# Patient Record
Sex: Female | Born: 2000 | Race: Black or African American | Hispanic: No | Marital: Single | Smoking: Never smoker
Health system: Southern US, Community
[De-identification: ages and names within clinical notes are randomized; demographics above are authoritative.]

## PROBLEM LIST (undated history)

## (undated) DIAGNOSIS — J45909 Unspecified asthma, uncomplicated: Secondary | ICD-10-CM

## (undated) HISTORY — DX: Unspecified asthma, uncomplicated: J45.909

---

## 2020-03-05 ENCOUNTER — Encounter: Payer: Self-pay | Admitting: Family Medicine

## 2020-03-05 ENCOUNTER — Ambulatory Visit: Payer: Self-pay | Admitting: Family Medicine

## 2020-03-05 ENCOUNTER — Other Ambulatory Visit: Payer: Self-pay

## 2020-03-05 DIAGNOSIS — Z113 Encounter for screening for infections with a predominantly sexual mode of transmission: Secondary | ICD-10-CM

## 2020-03-05 LAB — WET PREP FOR TRICH, YEAST, CLUE
Trichomonas Exam: NEGATIVE
Yeast Exam: NEGATIVE

## 2020-03-05 LAB — HEPATITIS B SURFACE ANTIGEN: Hepatitis B Surface Ag: NONREACTIVE

## 2020-03-05 NOTE — Progress Notes (Signed)
Here today for STD screening. Accepts bloodwork. Veyda Kaufman, RN ° °

## 2020-03-05 NOTE — Progress Notes (Addendum)
[place] results reviewed by provider Elveria Rising, FNP. Per provider orders no treatment indicated. Tawny Hopping, RN

## 2020-03-05 NOTE — Progress Notes (Addendum)
[facility] Department STI clinic/screening visit  Subjective:  Jeanne Steele is a 19 y.o. female being seen today for an STI screening visit. The patient reports they do not have symptoms.  Patient reports that they do not desire a pregnancy in the next year.   They reported they are not interested in discussing contraception today.  Patient's last menstrual period was 02/20/2020 (exact date).   Patient has the following medical conditions:  There are no problems to display for this patient.   Chief Complaint  Patient presents with  . SEXUALLY TRANSMITTED DISEASE    Screening    HPI  Patient reports partner reports that previous partner is possibly a contact and wants to be screened today.  Patient denies any problems or symptoms.    Last HIV test per patient, no previous HIV testing.  Patient has not had previous pap due to age.    See flowsheet for further details and programmatic requirements.    The following portions of the patient's history were reviewed and updated as appropriate: allergies, current medications, past medical history, past social history, past surgical history and problem list.  Objective:  There were no vitals filed for this visit.  Physical Exam Constitutional:      Appearance: Normal appearance.  HENT:     Head: Normocephalic.     Comments: In scalp, brows and lashes: no nits, no hair loss    Mouth/Throat:     Mouth: Mucous membranes are moist.     Pharynx: Oropharynx is clear. No oropharyngeal exudate or posterior oropharyngeal erythema.  Abdominal:     General: Abdomen is flat.     Palpations: Abdomen is soft. There is no mass.     Tenderness: There is no abdominal tenderness. There is no guarding or rebound.  Genitourinary:    Comments: External genitalia without, lice, nits, erythema, edema , lesions or inguinal adenopathy. Vagina with normal mucosa and discharge pooled in the vaginal canal and pH equals 4.  Cervix without visual  lesions, uterus firm, mobile, non-tender, no masses, CMT adnexal fullness or tenderness.  Musculoskeletal:     Cervical back: Normal range of motion and neck supple.  Lymphadenopathy:     Cervical: No cervical adenopathy.  Skin:    General: Skin is warm and dry.     Findings: No bruising, erythema, lesion or rash.  Neurological:     Mental Status: She is alert.  Psychiatric:        Mood and Affect: Mood normal.        Behavior: Behavior normal.      Assessment and Plan:  Jeanne Steele is a 19 y.o. female presenting to the [facility] Department for STI screening  1. Screening examination for venereal disease  - Chlamydia/Gonorrhea [place] [facility] - Gonococcus culture - HBV Antigen/Antibody [facility] - HIV [place] [facility] - Syphilis Serology, [place] [facility] - WET PREP FOR TRICH, YEAST, CLUE  Patient accepted all screenings including oral GC, vaginal CT/GC and bloodwork for HIV, RPR and HBV.  Patient meets criteria for HepB screening? Yes. Ordered? Yes Patient meets criteria for HepC screening? No. Ordered? No - does not meet critera   Wet prep results positive amine and clue but no other symptoms indicate BV  No Treatment needed today, await results of CT/GC.  Discussed time line for State [facility] results and that patient will be called with positive results and encouraged patient to call if she had not heard in 2 weeks.  Counseled to return or seek care for continued or worsening symptoms Recommended condom use with all sex   Patient is currently using condoms, discussed with patient LARCS, please give info on nexplanon and IUD.   to prevent pregnancy.   Inform patient text message was sent for my chart sign up and account was made confidential  Patient to call if any questions or concerns.    No follow-ups on file.  No future appointments.  Wendi Snipes, FNP

## 2020-03-09 LAB — GONOCOCCUS CULTURE

## 2020-11-16 ENCOUNTER — Emergency Department: Payer: BC Managed Care – PPO

## 2020-11-16 ENCOUNTER — Other Ambulatory Visit: Payer: Self-pay

## 2020-11-16 ENCOUNTER — Emergency Department
Admission: EM | Admit: 2020-11-16 | Discharge: 2020-11-16 | Disposition: A | Discharge status: Home / Self Care | Payer: BC Managed Care – PPO | Attending: Emergency Medicine | Admitting: Emergency Medicine

## 2020-11-16 ENCOUNTER — Encounter: Payer: Self-pay | Admitting: Emergency Medicine

## 2020-11-16 DIAGNOSIS — R0981 Nasal congestion: Secondary | ICD-10-CM | POA: Diagnosis not present

## 2020-11-16 DIAGNOSIS — J9801 Acute bronchospasm: Secondary | ICD-10-CM | POA: Insufficient documentation

## 2020-11-16 DIAGNOSIS — Z20822 Contact with and (suspected) exposure to covid-19: Secondary | ICD-10-CM | POA: Diagnosis not present

## 2020-11-16 DIAGNOSIS — R0602 Shortness of breath: Secondary | ICD-10-CM | POA: Diagnosis present

## 2020-11-16 LAB — BASIC METABOLIC PANEL
Anion gap: 8 (ref 5–15)
BUN: 11 mg/dL (ref 6–20)
CO2: 22 mmol/L (ref 22–32)
Calcium: 8.9 mg/dL (ref 8.9–10.3)
Chloride: 107 mmol/L (ref 98–111)
Creatinine, Ser: 0.55 mg/dL (ref 0.44–1.00)
GFR, Estimated: >60 mL/min (ref >60)
Glucose, Bld: 87 mg/dL (ref 70–99)
Potassium: 3.5 mmol/L (ref 3.5–5.1)
Sodium: 137 mmol/L (ref 135–145)

## 2020-11-16 LAB — RESP PANEL BY RT-PCR (FLU A&B, COVID) ARPGX2
Influenza A by PCR: NEGATIVE
Influenza B by PCR: NEGATIVE
SARS Coronavirus 2 by RT PCR: NEGATIVE

## 2020-11-16 LAB — CBC
HCT: 34.4 % — ABNORMAL LOW (ref 36.0–46.0)
Hemoglobin: 11.7 g/dL — ABNORMAL LOW (ref 12.0–15.0)
MCH: 28.7 pg (ref 26.0–34.0)
MCHC: 34 g/dL (ref 30.0–36.0)
MCV: 84.5 fL (ref 80.0–100.0)
Platelets: 387 10*3/uL (ref 150–400)
RBC: 4.07 MIL/uL (ref 3.87–5.11)
RDW: 12.3 % (ref 11.5–15.5)
WBC: 8.3 10*3/uL (ref 4.0–10.5)
nRBC: 0 % (ref 0.0–0.2)

## 2020-11-16 LAB — TROPONIN I (HIGH SENSITIVITY)
Troponin I (High Sensitivity): <2 ng/L (ref <18)
Troponin I (High Sensitivity): <2 ng/L (ref <18)

## 2020-11-16 MED ORDER — ALBUTEROL SULFATE HFA 108 (90 BASE) MCG/ACT IN AERS: 1.0000 | INHALATION_SPRAY | Freq: Four times a day (QID) | RESPIRATORY_TRACT | 0 refills | Status: DC | PRN

## 2020-11-16 MED ORDER — IPRATROPIUM-ALBUTEROL 0.5-2.5 (3) MG/3ML IN SOLN: 3.0000 mL | Freq: Once | RESPIRATORY_TRACT | Status: AC

## 2020-11-16 MED ORDER — IPRATROPIUM-ALBUTEROL 0.5-2.5 (3) MG/3ML IN SOLN
RESPIRATORY_TRACT | Status: AC
  Administered 2020-11-16: 3 mL via RESPIRATORY_TRACT
  Filled 2020-11-16: qty 3

## 2020-11-16 NOTE — ED Notes (Signed)
POC urine pregnancy test result negative. 

## 2020-11-16 NOTE — ED Provider Notes (Signed)
[facility] Emergency Department Provider Note ____________________________________________  Time seen: 1715  I have reviewed the triage vital signs and the nursing notes.  HISTORY  Chief Complaint  Chest Pain and Shortness of Breath   HPI Jeanne Steele is a 20 y.o. female presents to the ER today with complaint of nasal congestion, cough, chest tightness and shortness of breath.  She reports this started last night.  The cough is productive of clear/green mucus.  She reports that shortness of breath is intermittent.  She reports the chest tightness has resolved.  She reports 2 similar episodes that started 1 month ago each 2 weeks apart which occurred after swimming.  She thought it could be related to the chemicals in the pool however she did not go swimming prior to the onset of this third episode.  She denies headache, runny nose, ear pain sore throat or chest pain.  She denies fever, chills or body aches.  She has not had exposure to COVID that she is aware of.  She does not smoke.  She is unsure if she has a history of asthma but recalls growing up with an inhaler in the house but unsure if she used it or not.  She has not tried anything OTC for her symptoms.  History reviewed. No pertinent past medical history.  There are no problems to display for this patient.   History reviewed. No pertinent surgical history.  Prior to Admission medications   Medication Sig Start Date End Date Taking? Authorizing Provider  albuterol (VENTOLIN HFA) 108 (90 Base) MCG/ACT inhaler Inhale 1-2 puffs into the lungs every 6 (six) hours as needed for wheezing or shortness of breath. 11/16/20  Yes Lorre Munroe, NP    Allergies Patient has no known allergies.  History reviewed. No pertinent family history.  Social History Social History   Tobacco Use   Smoking status: Never   Smokeless tobacco: Never  Vaping Use   Vaping Use: Never used  Substance Use Topics   Alcohol  use: Yes    Alcohol/week: 2.0 standard drinks    Types: 2 Shots of liquor per week    Comment: socially    Drug use: Not Currently    Types: Marijuana    Comment: >3 months ago     Review of Systems  Constitutional: Negative for fever, chills or body aches. ENT: Positive for nasal congestion.  Negative for runny nose, ear pain or sore throat. Cardiovascular: Positive for intermittent chest tightness.  Negative for chest pain. Respiratory: Positive for cough and shortness of breath. Gastrointestinal: Negative for abdominal pain, nausea or reflux. Musculoskeletal: Negative for back pain. Skin: Negative for rash. Neurological: Negative for headaches, focal weakness, tingling or numbness. ____________________________________________  PHYSICAL EXAM:  VITAL SIGNS: ED Triage Vitals  Enc Vitals Group     BP 11/16/20 1547 135/86     Pulse Rate 11/16/20 1547 (!) 109     Resp 11/16/20 1547 (!) 25     Temp 11/16/20 1547 98.2 F (36.8 C)     Temp Source 11/16/20 1547 Oral     SpO2 11/16/20 1547 99 %     Weight 11/16/20 1548 126 lb (57.2 kg)     Height 11/16/20 1548 5' (1.524 m)     Head Circumference --      Peak Flow --      Pain Score 11/16/20 1548 0     Pain Loc --      Pain Edu? --  Excl. in GC? --     Constitutional: Alert and oriented. Well appearing and in no distress. Head: Normocephalic. Cardiovascular: Tachycardic, regular rhythm.  Respiratory: Normal respiratory effort.  Intermittent expiratory wheeze noted.  No rales/rhonchi. Gastrointestinal: Soft and nontender. No distention. Musculoskeletal: Nontender with normal range of motion in all extremities.  Neurologic:  Normal gait without ataxia. Normal speech and language. No gross focal neurologic deficits are appreciated. Skin:  Skin is warm, dry and intact. No rash noted. Psychiatric: Mood and affect are normal. Patient exhibits appropriate insight and judgment. ____________________________________________    LABS  Labs Reviewed  CBC - Abnormal; Notable for the following components:      Result Value   Hemoglobin 11.7 (*)    HCT 34.4 (*)    All other components within normal limits  RESP PANEL BY RT-PCR (FLU A&B, COVID) ARPGX2  BASIC METABOLIC PANEL  POC URINE PREG, ED  TROPONIN I (HIGH SENSITIVITY)  TROPONIN I (HIGH SENSITIVITY)    ____________________________________________  EKG ED ECG REPORT   Date: 11/16/2020  EKG Time: 7:09 PM  Rate: 114  Rhythm: sinus tachycardia,    Axis: right  Intervals:none  ST&T Change: none  Narrative Interpretation: Sinus tachycardia, none for comparison    ____________________________________________   RADIOLOGY IMPRESSION: No active cardiopulmonary disease.  ____________________________________________    INITIAL IMPRESSION / ASSESSMENT AND PLAN / ED COURSE  Nasal Congestion, Cough, Shortness of Breath, Chest Tightness:  DDx include viral URI with cough, covid, pneumonia, new onset asthma ECG shows sinus tachycardia Chest xray negative for acute findings Duoneb given with improvement in symptoms CBC, BMET, Troponin shows mild anemia but no other acute findings Covid swab negative RX for Albuterol 1-2 puffs Q4-6H as needed She will establish care with a PCP for followup ____________________________________________  FINAL CLINICAL IMPRESSION(S) / ED DIAGNOSES  Final diagnoses:  Bronchospasm      Lorre Munroe, NP 11/16/20 [name]    Shaune Pollack, MD 11/18/20 1207

## 2020-11-16 NOTE — ED Notes (Signed)
Pt states that she went to a pool party at the end of May and afterwards noticed that she was having some difficulty breathing and insomnia. Last night her symptoms started again and she is coughing up mucus. She denies chest pain at this moment and says that the breathing treatment she received earlier helped a lot. She is unaware of any asthma diagnosis. NAD on assessment.

## 2020-11-16 NOTE — Discharge Instructions (Addendum)
You were seen today for cough, chest tightness and shortness of breath.  Your x-ray and EKG were normal.  Your exam seems consistent with asthma.  I am giving you an inhaler to use every 4-6 hours as needed.  Please follow-up with a primary care physician for further evaluation of your symptoms.

## 2020-11-16 NOTE — ED Triage Notes (Addendum)
Pt via POV from home. Pt c/o centralized CP and SOB. Pt does have some difficulty breathing. Pt having issues with speaking in complete sentence and is wheezing. Unknown if she has a hx of asthma .

## 2020-12-09 ENCOUNTER — Other Ambulatory Visit: Payer: Self-pay | Admitting: Internal Medicine

## 2021-05-13 ENCOUNTER — Other Ambulatory Visit: Payer: Self-pay

## 2021-05-13 ENCOUNTER — Encounter: Payer: Self-pay | Admitting: Family Medicine

## 2021-05-13 ENCOUNTER — Ambulatory Visit (INDEPENDENT_AMBULATORY_CARE_PROVIDER_SITE_OTHER): Payer: BC Managed Care – PPO | Admitting: Family Medicine

## 2021-05-13 VITALS — BP 98/72 | HR 105 | Ht 60.0 in | Wt 139.0 lb

## 2021-05-13 DIAGNOSIS — J4520 Mild intermittent asthma, uncomplicated: Secondary | ICD-10-CM

## 2021-05-13 MED ORDER — ALBUTEROL SULFATE HFA 108 (90 BASE) MCG/ACT IN AERS: 1.0000 | INHALATION_SPRAY | Freq: Four times a day (QID) | RESPIRATORY_TRACT | 2 refills | Status: DC | PRN

## 2021-05-13 NOTE — Patient Instructions (Addendum)
-   Continue asthma inhaler (albuterol) on an as-needed basis - Track the times you need it and what triggers the asthma attack (exposure to fumes, etc) - Obtain your medical records for vaccines and health updates (HPV, tetanus, pap, etc) - Return in 4 weeks

## 2021-05-13 NOTE — Progress Notes (Signed)
°  ° °  Primary Care / Sports Medicine Office Visit  Patient Information:  Patient ID: Jeanne Steele, female DOB: 04-08-2001 Age: 20 y.o. MRN: [id number]   Jeanne Steele is a pleasant 20 y.o. female presenting with the following:  Chief Complaint  Patient presents with   New Patient (Initial Visit)   Establish Care   Asthma    Recent diagnosis 11/16/20 at [facility] ER; needs refill on albuterol inhaler    Patient Active Problem List   Diagnosis Date Noted   Mild intermittent asthma 05/13/2021    Vitals:   05/13/21 1112  BP: 98/72  Pulse: (!) 105  SpO2: 99%   Vitals:   05/13/21 1112  Weight: 139 lb (63 kg)  Height: 5' (1.524 m)   Body mass index is 27.15 kg/m.  No results found.   Independent interpretation of notes and tests performed by another provider:   None  Procedures performed:   None  Pertinent History, Exam, Impression, and Recommendations:   Mild intermittent asthma Patient with stated history of asthma, uncertain about diagnosis timeframe, possibly childhood, has noted progression over the past several months beginning in 09/2020.  At that time after exposure to swimming pool chlorine, had tightness which alleviated with time and home remedies, this recurred a few months later resulting in ER visit to [facility] on 11/16/2020 where albuterol inhaler was prescribed.  Since that time she has noted intermittent shortness of air, wheezing, chest tightness, aggravation with cold air, exercise, fumes, smoke, no further progression/stability of symptoms, and no significant limitations in daily activity.  She has missed 1 day of work over the past 1 month, requires 1-2 uses of her albuterol rescue inhaler weekly, denies any nighttime awakenings.  Based on her stated symptomatology, frequency of albuterol usage, she is considered to have intermittent asthma.  At this stage we have reviewed further evaluation and treatment, I have prescribed albuterol for as  needed usage, and we will follow-up on her symptoms in 1 month's time at her annual physical.   Orders & Medications Meds ordered this encounter  Medications   DISCONTD: albuterol (VENTOLIN HFA) 108 (90 Base) MCG/ACT inhaler    Sig: Inhale 1-2 puffs into the lungs every 6 (six) hours as needed for wheezing or shortness of breath.    Dispense:  8 g    Refill:  2   albuterol (VENTOLIN HFA) 108 (90 Base) MCG/ACT inhaler    Sig: Inhale 1-2 puffs into the lungs every 6 (six) hours as needed for wheezing or shortness of breath.    Dispense:  8 g    Refill:  2   No orders of the defined types were placed in this encounter.    Return in about 4 weeks (around 06/10/2021) for Annual physical 40 min.     Jerrol Banana, MD   Primary Care Sports Medicine [facility]

## 2021-05-13 NOTE — Assessment & Plan Note (Addendum)
Patient with stated history of asthma, uncertain about diagnosis timeframe, possibly childhood, has noted progression over the past several months beginning in 09/2020.  At that time after exposure to swimming pool chlorine, had tightness which alleviated with time and home remedies, this recurred a few months later resulting in ER visit to [facility] on 11/16/2020 where albuterol inhaler was prescribed.  Since that time she has noted intermittent shortness of air, wheezing, chest tightness, aggravation with cold air, exercise, fumes, smoke, no further progression/stability of symptoms, and no significant limitations in daily activity.  She has missed 1 day of work over the past 1 month, requires 1-2 uses of her albuterol rescue inhaler weekly, denies any nighttime awakenings.  Based on her stated symptomatology, frequency of albuterol usage, she is considered to have intermittent asthma.  At this stage we have reviewed further evaluation and treatment, I have prescribed albuterol for as needed usage after a discussion on alternate treatment strategies, and we will follow-up on her symptoms in 1 month's time at her annual physical.  Review of chest x-ray dated 11/16/2020, ER note from 11/16/2020, and office visit note from 03/05/2020 reviewed as part of care for patient today.

## 2021-06-10 ENCOUNTER — Encounter: Payer: BC Managed Care – PPO | Admitting: Family Medicine

## 2021-06-16 ENCOUNTER — Other Ambulatory Visit: Payer: Self-pay

## 2021-06-16 ENCOUNTER — Ambulatory Visit (INDEPENDENT_AMBULATORY_CARE_PROVIDER_SITE_OTHER): Payer: BC Managed Care – PPO | Admitting: Family Medicine

## 2021-06-16 ENCOUNTER — Encounter: Payer: Self-pay | Admitting: Family Medicine

## 2021-06-16 VITALS — BP 98/66 | HR 90 | Ht 60.0 in | Wt 138.0 lb

## 2021-06-16 DIAGNOSIS — Z114 Encounter for screening for human immunodeficiency virus [HIV]: Secondary | ICD-10-CM

## 2021-06-16 DIAGNOSIS — Z0000 Encounter for general adult medical examination without abnormal findings: Secondary | ICD-10-CM | POA: Diagnosis not present

## 2021-06-16 DIAGNOSIS — Z1159 Encounter for screening for other viral diseases: Secondary | ICD-10-CM

## 2021-06-16 DIAGNOSIS — Z113 Encounter for screening for infections with a predominantly sexual mode of transmission: Secondary | ICD-10-CM

## 2021-06-16 DIAGNOSIS — J4520 Mild intermittent asthma, uncomplicated: Secondary | ICD-10-CM

## 2021-06-16 DIAGNOSIS — Z13220 Encounter for screening for lipoid disorders: Secondary | ICD-10-CM

## 2021-06-16 DIAGNOSIS — R7989 Other specified abnormal findings of blood chemistry: Secondary | ICD-10-CM

## 2021-06-16 NOTE — Patient Instructions (Signed)
-   Obtain fasting labs with orders provided (can have water or black coffee but otherwise no food or drink x 8 hours before labs) - Review information provided - Check in with gynecology for routine cervical cancer screening (Pap smear) - Attend eye doctor annually, dentist every 6 months, work towards or maintain 30 minutes of moderate intensity physical activity at least 5 days per week, and consume a balanced diet - Return in 1 year for physical - Contact us for any questions between now and then

## 2021-06-16 NOTE — Assessment & Plan Note (Addendum)
Annual examination completed, risk stratification labs ordered, anticipatory guidance provided.  Patient has declined immunizations today.  We will follow labs once resulted.

## 2021-06-16 NOTE — Progress Notes (Signed)
Annual Physical Exam Visit  Patient Information:  Patient ID: Jeanne Steele, female DOB: [birth year] Age: 21 y.o. MRN: [id number]   Subjective:   CC: Annual Physical Exam  HPI:  Jeanne Steele is here for their annual physical.  I reviewed the past medical history, family history, social history, surgical history, and allergies today and changes were made as necessary.  Please see the problem list section below for additional details.  Past Medical History: History reviewed. No pertinent past medical history. Past Surgical History: History reviewed. No pertinent surgical history. Family History: Family History  Problem Relation Age of Onset   Seizures Mother    Seizures Brother    Allergies: No Known Allergies Health Maintenance: Health Maintenance  Topic Date Due   HPV VACCINES (1 - 2-dose series) Never done   CHLAMYDIA SCREENING  Never done   Hepatitis C Screening  Never done   COVID-19 Vaccine (3 - Booster for Moderna series) 03/21/2021   PAP-Cervical Cytology Screening  08/14/2021 (Originally 02/23/2021)   INFLUENZA VACCINE  08/14/2021 (Originally 12/15/2020)   PAP SMEAR-Modifier  08/14/2021 (Originally 02/23/2021)   TETANUS/TDAP  08/14/2021 (Originally 02/24/2019)   HIV Screening  Completed    HM Colonoscopy     This patient has no relevant Health Maintenance data.      Medications: Current Outpatient Medications on File Prior to Visit  Medication Sig Dispense Refill   albuterol (VENTOLIN HFA) 108 (90 Base) MCG/ACT inhaler Inhale 1-2 puffs into the lungs every 6 (six) hours as needed for wheezing or shortness of breath. 8 g 2   JUNEL FE 1.5/30 1.5-30 MG-MCG tablet Take 1 tablet by mouth daily.     No current facility-administered medications on file prior to visit.    Review of Systems: No headache, visual changes, nausea, vomiting, diarrhea, constipation, dizziness, abdominal pain, skin rash, fevers, chills, night sweats, swollen lymph nodes, weight loss,  chest pain, body aches, joint swelling, muscle aches, shortness of breath, mood changes, visual or auditory hallucinations reported.  Objective:   Vitals:   06/16/21 0825  BP: 98/66  Pulse: 90  SpO2: 98%   Vitals:   06/16/21 0825  Weight: 138 lb (62.6 kg)  Height: 5' (1.524 m)   Body mass index is 26.95 kg/m.  General: Well Developed, well nourished, and in no acute distress.  Neuro: Alert and oriented x3, extra-ocular muscles intact, sensation grossly intact. Cranial nerves II through XII are grossly intact, motor, sensory, and coordinative functions are intact. HEENT: Normocephalic, atraumatic, pupils equal round reactive to light, neck supple, no masses, no lymphadenopathy, thyroid nonpalpable. Oropharynx, nasopharynx, external ear canals are unremarkable. Skin: Warm and dry, no rashes noted.  Cardiac: Regular rate and rhythm, no murmurs rubs or gallops. No peripheral edema. Pulses symmetric. Respiratory: Clear to auscultation bilaterally. Not using accessory muscles, speaking in full sentences.  Abdominal: Soft, nontender, nondistended, positive bowel sounds, no masses, no organomegaly. Musculoskeletal: Shoulder, elbow, wrist, hip, knee, ankle stable, and with full range of motion.  Female chaperone initials: BN present throughout the physical examination.  Impression and Recommendations:   The patient was counselled, risk factors were discussed, and anticipatory guidance given.  Annual physical exam Annual examination completed, risk stratification labs ordered, anticipatory guidance provided.  Patient has declined immunizations today.  We will follow labs once resulted.   Mild intermittent asthma Patient states that over interval visit she has continued to dose albuterol on an as-needed basis, requiring roughly 1-2 uses, denies nighttime awakenings, no significant limitations or  disruptions of her daily activities, no missed work due to asthma symptoms.  She did have a few  days of worsening symptoms that have since resolved with she attributes to viral symptoms.  At this stage she is still considered to have mild intermittent asthma, can continue with her as needed albuterol inhaler use.  If symptoms were to worsen or increase in frequency, she would benefit from maintenance medication.  Orders & Medications Medications: No orders of the defined types were placed in this encounter.  Orders Placed This Encounter  Procedures   GC/Chlamydia Probe Amp([facility])   HIV antibody (with reflex)   Hepatitis C Antibody   Lipid panel   Apo A1 + B + Ratio   CBC   Comprehensive metabolic panel   TSH   VITAMIN D 25 Hydroxy (Vit-D Deficiency, Fractures)     Return in about 1 year (around 06/16/2022) for Annual physical.    Jerrol Banana, MD   Primary Care Sports Medicine [facility]

## 2021-06-16 NOTE — Assessment & Plan Note (Signed)
Patient states that over interval visit she has continued to dose albuterol on an as-needed basis, requiring roughly 1-2 uses, denies nighttime awakenings, no significant limitations or disruptions of her daily activities, no missed work due to asthma symptoms.  She did have a few days of worsening symptoms that have since resolved with she attributes to viral symptoms.  At this stage she is still considered to have mild intermittent asthma, can continue with her as needed albuterol inhaler use.  If symptoms were to worsen or increase in frequency, she would benefit from maintenance medication.

## 2021-06-17 ENCOUNTER — Other Ambulatory Visit: Payer: Self-pay | Admitting: Family Medicine

## 2021-06-17 DIAGNOSIS — R7989 Other specified abnormal findings of blood chemistry: Secondary | ICD-10-CM

## 2021-06-17 DIAGNOSIS — D75839 Thrombocytosis, unspecified: Secondary | ICD-10-CM

## 2021-06-17 LAB — CBC
Hematocrit: 36.2 % (ref 34.0–46.6)
Hemoglobin: 12.1 g/dL (ref 11.1–15.9)
MCH: 26.8 pg (ref 26.6–33.0)
MCHC: 33.4 g/dL (ref 31.5–35.7)
MCV: 80 fL (ref 79–97)
Platelets: 468 10*3/uL — ABNORMAL HIGH (ref 150–450)
RBC: 4.51 x10E6/uL (ref 3.77–5.28)
RDW: 12.8 % (ref 11.7–15.4)
WBC: 5.1 10*3/uL (ref 3.4–10.8)

## 2021-06-17 LAB — LIPID PANEL
Chol/HDL Ratio: 2.9 ratio (ref 0.0–4.4)
Cholesterol, Total: 131 mg/dL (ref 100–199)
HDL: 45 mg/dL (ref >39)
LDL Chol Calc (NIH): 73 mg/dL (ref 0–99)
Triglycerides: 63 mg/dL (ref 0–149)
VLDL Cholesterol Cal: 13 mg/dL (ref 5–40)

## 2021-06-17 LAB — APO A1 + B + RATIO
Apolipo. B/A-1 Ratio: 0.4 ratio (ref 0.0–0.6)
Apolipoprotein A-1: 122 mg/dL (ref 116–209)
Apolipoprotein B: 45 mg/dL (ref <90)

## 2021-06-17 LAB — COMPREHENSIVE METABOLIC PANEL
ALT: 8 IU/L (ref 0–32)
AST: 20 IU/L (ref 0–40)
Albumin/Globulin Ratio: 1.5 (ref 1.2–2.2)
Albumin: 4.2 g/dL (ref 3.9–5.0)
Alkaline Phosphatase: 96 IU/L (ref 44–121)
BUN/Creatinine Ratio: 9 (ref 9–23)
BUN: 7 mg/dL (ref 6–20)
Bilirubin Total: 0.4 mg/dL (ref 0.0–1.2)
CO2: 20 mmol/L (ref 20–29)
Calcium: 9.1 mg/dL (ref 8.7–10.2)
Chloride: 103 mmol/L (ref 96–106)
Creatinine, Ser: 0.75 mg/dL (ref 0.57–1.00)
Globulin, Total: 2.8 g/dL (ref 1.5–4.5)
Glucose: 88 mg/dL (ref 70–99)
Potassium: 4.5 mmol/L (ref 3.5–5.2)
Sodium: 139 mmol/L (ref 134–144)
Total Protein: 7 g/dL (ref 6.0–8.5)
eGFR: 116 mL/min/{1.73_m2} (ref >59)

## 2021-06-17 LAB — HEPATITIS C ANTIBODY: Hep C Virus Ab: <0.1 s/co ratio (ref 0.0–0.9)

## 2021-06-17 LAB — TSH: TSH: 1.04 u[IU]/mL (ref 0.450–4.500)

## 2021-06-17 LAB — HIV ANTIBODY (ROUTINE TESTING W REFLEX): HIV Screen 4th Generation wRfx: NONREACTIVE

## 2021-06-17 LAB — VITAMIN D 25 HYDROXY (VIT D DEFICIENCY, FRACTURES): Vit D, 25-Hydroxy: 9.3 ng/mL — ABNORMAL LOW (ref 30.0–100.0)

## 2021-06-17 MED ORDER — VITAMIN D (ERGOCALCIFEROL) 1.25 MG (50000 UNIT) PO CAPS: 50000.0000 [IU] | ORAL_CAPSULE | ORAL | 0 refills | Status: DC

## 2021-06-18 ENCOUNTER — Other Ambulatory Visit: Payer: Self-pay

## 2021-06-18 DIAGNOSIS — D75839 Thrombocytosis, unspecified: Secondary | ICD-10-CM

## 2021-06-18 DIAGNOSIS — R790 Abnormal level of blood mineral: Secondary | ICD-10-CM

## 2021-06-18 LAB — IRON,TIBC AND FERRITIN PANEL
Ferritin: 43 ng/mL (ref 15–150)
Iron Saturation: 9 % — CL (ref 15–55)
Iron: 32 ug/dL (ref 27–159)
Total Iron Binding Capacity: 372 ug/dL (ref 250–450)
UIBC: 340 ug/dL (ref 131–425)

## 2021-06-18 LAB — RETICULOCYTES: Retic Ct Pct: 1.3 % (ref 0.6–2.6)

## 2021-06-18 LAB — SPECIMEN STATUS REPORT

## 2021-06-18 LAB — GC/CHLAMYDIA PROBE AMP
Chlamydia trachomatis, NAA: NEGATIVE
Neisseria Gonorrhoeae by PCR: NEGATIVE

## 2021-09-16 ENCOUNTER — Ambulatory Visit: Payer: BC Managed Care – PPO | Admitting: Family Medicine

## 2021-12-25 ENCOUNTER — Other Ambulatory Visit: Payer: Self-pay | Admitting: Family Medicine

## 2021-12-25 ENCOUNTER — Other Ambulatory Visit: Payer: Self-pay | Admitting: *Deleted

## 2021-12-25 ENCOUNTER — Ambulatory Visit: Payer: Self-pay | Admitting: *Deleted

## 2021-12-25 DIAGNOSIS — J4520 Mild intermittent asthma, uncomplicated: Secondary | ICD-10-CM

## 2021-12-25 NOTE — Telephone Encounter (Signed)
This encounter was created in error - please disregard.

## 2021-12-25 NOTE — Telephone Encounter (Signed)
Duplicate request. MD ordered after seeing Triage note. Requested Prescriptions  Refused Prescriptions Disp Refills  . albuterol (VENTOLIN HFA) 108 (90 Base) MCG/ACT inhaler 8 g 2     Pulmonology:  Beta Agonists 2 Passed - 12/25/2021  5:26 PM      Passed - Last BP in normal range    BP Readings from Last 1 Encounters:  06/16/21 98/66         Passed - Last Heart Rate in normal range    Pulse Readings from Last 1 Encounters:  06/16/21 90         Passed - Valid encounter within last 12 months    Recent Outpatient Visits          6 months ago Annual physical exam   [facility] Jerrol Banana, MD   7 months ago Mild intermittent asthma, unspecified whether complicated   [facility] Jerrol Banana, MD      Future Appointments            In 2 weeks Ashley Royalty, Ocie Bob, MD [facility], [facility]   In 5 months Ashley Royalty, Ocie Bob, MD [facility], [facility]

## 2021-12-25 NOTE — Telephone Encounter (Signed)
  Chief Complaint: Asthma attack Symptoms: left inhaler at home Frequency: just now Pertinent Negatives: Patient denies distress once reached home and used inhaler Disposition: [] ED /[] Urgent Care (no appt availability in office) / [x] Appointment(In office/virtual)/ []  [facility]/ [] Home Care/ [] Refused Recommended Disposition /[] [facility]/ []  Follow-up with PCP Additional Notes: Office visit made and refill called in. Pt called when she left work after someone with perfume walked by and she started having difficulty breathing. She had left inhaler at home. I offered to send in refill to pharm she was close to but she stated she was 2 minutes from home. I stayed on phone with her until she got home and used it. Pt was immediately improved. Appt made, inhaler rx refilled, pt was not sure how much current inhaler had left. Pt breathing normally at end of call.   Reason for Disposition  [1] MILD longstanding difficulty breathing AND [2]  SAME as normal  Answer Assessment - Initial Assessment Questions 1. RESPIRATORY STATUS: "Describe your breathing?" (e.g., wheezing, shortness of breath, unable to speak, severe coughing)      Sob, can talk, states she is wheezing 2. ONSET: "When did this breathing problem begin?"      She was at work and someone with perfume walked by and it started 3. PATTERN "Does the difficult breathing come and go, or has it been constant since it started?"      constant 4. SEVERITY: "How bad is your breathing?" (e.g., mild, moderate, severe)    - MILD: No SOB at rest, mild SOB with walking, speaks normally in sentences, can lie down, no retractions, pulse < 100.    - MODERATE: SOB at rest, SOB with minimal exertion and prefers to sit, cannot lie down flat, speaks in phrases, mild retractions, audible wheezing, pulse 100-120.    - SEVERE: Very SOB at rest, speaks in single words, struggling to breathe, sitting hunched forward, retractions, pulse > 120       6 5. RECURRENT SYMPTOM: "Have you had difficulty breathing before?" If Yes, ask: "When was the last time?" and "What happened that time?"      Yes, but inhaler fixes it but it is at home 6. CARDIAC HISTORY: "Do you have any history of heart disease?" (e.g., heart attack, angina, bypass surgery, angioplasty)      no 7. LUNG HISTORY: "Do you have any history of lung disease?"  (e.g., pulmonary embolus, asthma, emphysema)     asthma 8. CAUSE: "What do you think is causing the breathing problem?"      Someone's perfume 9. OTHER SYMPTOMS: "Do you have any other symptoms? (e.g., dizziness, runny nose, cough, chest pain, fever)     no 10. O2 SATURATION MONITOR:  "Do you use an oxygen saturation monitor (pulse oximeter) at home?" If Yes, ask: "What is your reading (oxygen level) today?" "What is your usual oxygen saturation reading?" (e.g., 95%)       no 11. PREGNANCY: "Is there any chance you are pregnant?" "When was your last menstrual period?"       no 12. TRAVEL: "Have you traveled out of the country in the last month?" (e.g., travel history, exposures)       no  Protocols used: Breathing Difficulty-A-AH

## 2021-12-25 NOTE — Telephone Encounter (Signed)
Requested Prescriptions  Pending Prescriptions Disp Refills  . albuterol (VENTOLIN HFA) 108 (90 Base) MCG/ACT inhaler [Pharmacy Med Name: ALBUTEROL HFA INH (200 PUFFS) 6.7GM] 8 g 2    Sig: INHALE 1 TO 2 PUFFS INTO THE LUNGS EVERY 6 HOURS AS NEEDED FOR WHEEZING OR SHORTNESS OF BREATH     Pulmonology:  Beta Agonists 2 Passed - 12/25/2021  4:47 PM      Passed - Last BP in normal range    BP Readings from Last 1 Encounters:  06/16/21 98/66         Passed - Last Heart Rate in normal range    Pulse Readings from Last 1 Encounters:  06/16/21 90         Passed - Valid encounter within last 12 months    Recent Outpatient Visits          6 months ago Annual physical exam   [facility] Jerrol Banana, MD   7 months ago Mild intermittent asthma, unspecified whether complicated   [facility] Jerrol Banana, MD      Future Appointments            In 5 months Ashley Royalty, Ocie Bob, MD [facility]

## 2021-12-28 NOTE — Telephone Encounter (Signed)
Noted pt has appointment.

## 2022-01-12 ENCOUNTER — Ambulatory Visit: Payer: BC Managed Care – PPO | Admitting: Family Medicine

## 2022-01-19 ENCOUNTER — Other Ambulatory Visit: Payer: Self-pay | Admitting: Family Medicine

## 2022-01-19 DIAGNOSIS — J4520 Mild intermittent asthma, uncomplicated: Secondary | ICD-10-CM

## 2022-01-20 NOTE — Telephone Encounter (Signed)
Rx 12/15/21 8g 2RF- too soon Requested Prescriptions  Pending Prescriptions Disp Refills  . albuterol (VENTOLIN HFA) 108 (90 Base) MCG/ACT inhaler [Pharmacy Med Name: ALBUTEROL HFA INH (200 PUFFS) 6.7GM] 6.7 g     Sig: INHALE 1 TO 2 PUFFS INTO THE LUNGS EVERY 6 HOURS AS NEEDED FOR WHEEZING OR SHORTNESS OF BREATH     Pulmonology:  Beta Agonists 2 Passed - 01/19/2022 12:57 PM      Passed - Last BP in normal range    BP Readings from Last 1 Encounters:  06/16/21 98/66         Passed - Last Heart Rate in normal range    Pulse Readings from Last 1 Encounters:  06/16/21 90         Passed - Valid encounter within last 12 months    Recent Outpatient Visits          7 months ago Annual physical exam    Primary Care and Sports Medicine at [facility], Ocie Bob, MD   8 months ago Mild intermittent asthma, unspecified whether complicated   [facility] Primary Care and [facility] Medicine at [facility], Ocie Bob, MD      Future Appointments            In 4 months Ashley Royalty, Ocie Bob, MD [facility] Primary Care and Sports Medicine at [facility], [facility]

## 2022-03-31 ENCOUNTER — Other Ambulatory Visit: Payer: Self-pay

## 2022-03-31 ENCOUNTER — Emergency Department: Payer: BC Managed Care – PPO

## 2022-03-31 ENCOUNTER — Encounter: Payer: Self-pay | Admitting: Emergency Medicine

## 2022-03-31 ENCOUNTER — Emergency Department
Admission: EM | Admit: 2022-03-31 | Discharge: 2022-03-31 | Disposition: A | Discharge status: Home / Self Care | Payer: BC Managed Care – PPO | Attending: Emergency Medicine | Admitting: Emergency Medicine

## 2022-03-31 DIAGNOSIS — J45901 Unspecified asthma with (acute) exacerbation: Secondary | ICD-10-CM | POA: Insufficient documentation

## 2022-03-31 DIAGNOSIS — R0602 Shortness of breath: Secondary | ICD-10-CM | POA: Diagnosis present

## 2022-03-31 DIAGNOSIS — R0603 Acute respiratory distress: Secondary | ICD-10-CM

## 2022-03-31 DIAGNOSIS — Z20822 Contact with and (suspected) exposure to covid-19: Secondary | ICD-10-CM | POA: Diagnosis not present

## 2022-03-31 LAB — CBC WITH DIFFERENTIAL/PLATELET
Abs Immature Granulocytes: 0.01 10*3/uL (ref 0.00–0.07)
Basophils Absolute: 0.1 10*3/uL (ref 0.0–0.1)
Basophils Relative: 1 %
Eosinophils Absolute: 1 10*3/uL — ABNORMAL HIGH (ref 0.0–0.5)
Eosinophils Relative: 13 %
HCT: 37.4 % (ref 36.0–46.0)
Hemoglobin: 12.2 g/dL (ref 12.0–15.0)
Immature Granulocytes: 0 %
Lymphocytes Relative: 45 %
Lymphs Abs: 3.5 10*3/uL (ref 0.7–4.0)
MCH: 27.1 pg (ref 26.0–34.0)
MCHC: 32.6 g/dL (ref 30.0–36.0)
MCV: 82.9 fL (ref 80.0–100.0)
Monocytes Absolute: 0.6 10*3/uL (ref 0.1–1.0)
Monocytes Relative: 7 %
Neutro Abs: 2.7 10*3/uL (ref 1.7–7.7)
Neutrophils Relative %: 34 %
Platelets: 443 10*3/uL — ABNORMAL HIGH (ref 150–400)
RBC: 4.51 MIL/uL (ref 3.87–5.11)
RDW: 13.2 % (ref 11.5–15.5)
WBC: 7.7 10*3/uL (ref 4.0–10.5)
nRBC: 0 % (ref 0.0–0.2)

## 2022-03-31 LAB — RESP PANEL BY RT-PCR (FLU A&B, COVID) ARPGX2
Influenza A by PCR: NEGATIVE
Influenza B by PCR: NEGATIVE
SARS Coronavirus 2 by RT PCR: NEGATIVE

## 2022-03-31 LAB — BASIC METABOLIC PANEL
Anion gap: 5 (ref 5–15)
BUN: 12 mg/dL (ref 6–20)
CO2: 25 mmol/L (ref 22–32)
Calcium: 8.9 mg/dL (ref 8.9–10.3)
Chloride: 111 mmol/L (ref 98–111)
Creatinine, Ser: 0.78 mg/dL (ref 0.44–1.00)
GFR, Estimated: >60 mL/min (ref >60)
Glucose, Bld: 95 mg/dL (ref 70–99)
Potassium: 4.1 mmol/L (ref 3.5–5.1)
Sodium: 141 mmol/L (ref 135–145)

## 2022-03-31 LAB — HCG, QUANTITATIVE, PREGNANCY: hCG, Beta Chain, Quant, S: <1 m[IU]/mL (ref <5)

## 2022-03-31 MED ORDER — ALBUTEROL SULFATE (2.5 MG/3ML) 0.083% IN NEBU
5.0000 mg | INHALATION_SOLUTION | Freq: Once | RESPIRATORY_TRACT | Status: AC
  Administered 2022-03-31: 5 mg via RESPIRATORY_TRACT
  Filled 2022-03-31: qty 6

## 2022-03-31 MED ORDER — IPRATROPIUM BROMIDE 0.02 % IN SOLN
1.0000 mg | Freq: Once | RESPIRATORY_TRACT | Status: AC
  Administered 2022-03-31: 1 mg via RESPIRATORY_TRACT
  Filled 2022-03-31: qty 5

## 2022-03-31 MED ORDER — IPRATROPIUM BROMIDE 0.02 % IN SOLN
0.5000 mg | Freq: Once | RESPIRATORY_TRACT | Status: AC
  Administered 2022-03-31: 0.5 mg via RESPIRATORY_TRACT
  Filled 2022-03-31: qty 2.5

## 2022-03-31 MED ORDER — PREDNISONE 20 MG PO TABS: 60.0000 mg | ORAL_TABLET | Freq: Every day | ORAL | 0 refills | Status: DC

## 2022-03-31 MED ORDER — METHYLPREDNISOLONE SODIUM SUCC 125 MG IJ SOLR
125.0000 mg | Freq: Once | INTRAMUSCULAR | Status: AC
  Administered 2022-03-31: 125 mg via INTRAVENOUS
  Filled 2022-03-31: qty 2

## 2022-03-31 MED ORDER — MAGNESIUM SULFATE 2 GM/50ML IV SOLN
2.0000 g | Freq: Once | INTRAVENOUS | Status: AC
  Administered 2022-03-31: 2 g via INTRAVENOUS
  Filled 2022-03-31: qty 50

## 2022-03-31 MED ORDER — ALBUTEROL SULFATE HFA 108 (90 BASE) MCG/ACT IN AERS: 2.0000 | INHALATION_SPRAY | RESPIRATORY_TRACT | 2 refills | Status: DC | PRN

## 2022-03-31 NOTE — Discharge Instructions (Addendum)
Labs reassuring.  COVID and flu negative.  Chest x-ray clear.

## 2022-03-31 NOTE — ED Triage Notes (Signed)
Pt arrived via POV with reports of shortness of breath, pt states he has hx of asthma, states she ran out of her inhaler, pt with audible wheezing, c/o shortness of breath

## 2022-03-31 NOTE — ED Provider Notes (Signed)
[facility] Provider Note    Event Date/Time   First MD Initiated Contact with Patient 03/31/22 0510     (approximate)   History   Shortness of Breath   HPI  Jeanne Steele is a 21 y.o. female to the emergency department with an asthma exacerbation.  Shortness of breath and wheezing started today.  No fevers.  Does have chest tightness.   History provided by patient and significant other.    History reviewed. No pertinent past medical history.  History reviewed. No pertinent surgical history.  MEDICATIONS:  Prior to Admission medications   Medication Sig Start Date End Date Taking? Authorizing Provider  albuterol (VENTOLIN HFA) 108 (90 Base) MCG/ACT inhaler INHALE 1 TO 2 PUFFS INTO THE LUNGS EVERY 6 HOURS AS NEEDED FOR WHEEZING OR SHORTNESS OF BREATH 12/25/21   Jerrol Banana, MD  JUNEL FE 1.5/30 1.5-30 MG-MCG tablet Take 1 tablet by mouth daily. 04/05/21   [provider]  Vitamin D, Ergocalciferol, (DRISDOL) 1.25 MG (50000 UNIT) CAPS capsule Take 1 capsule (50,000 Units total) by mouth every 7 (seven) days. Take for 8 total doses(weeks) 06/17/21   Jerrol Banana, MD    Physical Exam   Triage Vital Signs: ED Triage Vitals [03/31/22 0506]  Enc Vitals Group     BP (!) 141/105     Pulse Rate (!) 116     Resp      Temp      Temp src      SpO2 97 %     Weight      Height      Head Circumference      Peak Flow      Pain Score      Pain Loc      Pain Edu?      Excl. in GC?     Most recent vital signs: Vitals:   03/31/22 0600 03/31/22 0630  BP: 133/75 120/74  Pulse: 98 (!) 115  Resp: 18 (!) 22  Temp:    SpO2: 100% 97%    CONSTITUTIONAL: Alert and oriented and responds appropriately to questions.  In respiratory distress HEAD: Normocephalic, atraumatic EYES: Conjunctivae clear, pupils appear equal, sclera nonicteric ENT: normal nose; moist mucous membranes NECK: Supple, normal ROM CARD: Tachycardic; S1 and S2  appreciated; no murmurs, no clicks, no rubs, no gallops RESP: Diffuse inspiratory and expiratory wheezing, significantly diminished aeration, no rhonchi or rales, patient is not hypoxic but in respiratory distress and speaking in short sentences, patient tripoding with mild pursed lip breathing ABD/GI: Normal bowel sounds; non-distended; soft, non-tender, no rebound, no guarding, no peritoneal signs BACK: The back appears normal EXT: Normal ROM in all joints; no deformity noted, no edema; no cyanosis no calf tenderness or calf swelling SKIN: Normal color for age and race; warm; no rash on exposed skin NEURO: Moves all extremities equally, normal speech PSYCH: The patient's mood and manner are appropriate.   ED Results / Procedures / Treatments   LABS: (all labs ordered are listed, but only abnormal results are displayed) Labs Reviewed  CBC WITH DIFFERENTIAL/PLATELET - Abnormal; Notable for the following components:      Result Value   Platelets 443 (*)    Eosinophils Absolute 1.0 (*)    All other components within normal limits  RESP PANEL BY RT-PCR (FLU A&B, COVID) ARPGX2  BASIC METABOLIC PANEL  HCG, QUANTITATIVE, PREGNANCY     EKG:  EKG Interpretation  Date/Time:  Wednesday March 31 2022  05:28:40 EST Ventricular Rate:  95 PR Interval:  138 QRS Duration: 82 QT Interval:  346 QTC Calculation: 435 R Axis:   4 Text Interpretation: Sinus rhythm ST elev, probable normal early repol pattern Confirmed by Pryor Curia [phone]) on 03/31/2022 5:30:59 AM         RADIOLOGY: My personal review and interpretation of imaging: Chest x-ray clear.  I have personally reviewed all radiology reports.   DG Chest Portable 1 View  Result Date: 03/31/2022 CLINICAL DATA:  21 year old female with history of shortness of breath. EXAM: PORTABLE CHEST 1 VIEW COMPARISON:  Chest x-ray 11/16/2020. FINDINGS: Lung volumes are normal. No consolidative airspace disease. No pleural effusions. No  pneumothorax. No pulmonary nodule or mass noted. Pulmonary vasculature and the cardiomediastinal silhouette are within normal limits. IMPRESSION: No radiographic evidence of acute cardiopulmonary disease. Electronically Signed   By: Vinnie Langton M.D.   On: 03/31/2022 05:31     PROCEDURES:  Critical Care performed: Yes, see critical care procedure note(s)   CRITICAL CARE Performed by: Cyril Mourning Deniqua Perry   Total critical care time: 45 minutes  Critical care time was exclusive of separately billable procedures and treating other patients.  Critical care was necessary to treat or prevent imminent or life-threatening deterioration.  Critical care was time spent personally by me on the following activities: development of treatment plan with patient and/or surrogate as well as nursing, discussions with consultants, evaluation of patient's response to treatment, examination of patient, obtaining history from patient or surrogate, ordering and performing treatments and interventions, ordering and review of laboratory studies, ordering and review of radiographic studies, pulse oximetry and re-evaluation of patient's condition.   Marland Kitchen1-3 Lead EKG Interpretation  Performed by: Delbert Vu, Delice Bison, DO Authorized by: Nishawn Rotan, Delice Bison, DO     Interpretation: abnormal     ECG rate:  116   ECG rate assessment: normal     Rhythm: sinus tachycardia     Ectopy: none     Conduction: normal       IMPRESSION / MDM / ASSESSMENT AND PLAN / ED COURSE  I reviewed the triage vital signs and the nursing notes.    Patient here in moderate to severe respiratory distress with asthma exacerbation.  The patient is on the cardiac monitor to evaluate for evidence of arrhythmia and/or significant heart rate changes.   DIFFERENTIAL DIAGNOSIS (includes but not limited to):   Asthma, PE, pneumonia, viral URI, less likely ACS, dissection, CHF   Patient's presentation is most consistent with acute presentation with  potential threat to life or bodily function.   PLAN: We will obtain CBC, BMP, hCG, chest x-ray.  Will give albuterol, Atrovent, Solu-Medrol, magnesium.   MEDICATIONS GIVEN IN ED: Medications  albuterol (PROVENTIL) (2.5 MG/3ML) 0.083% nebulizer solution 5 mg (5 mg Nebulization Given 03/31/22 0520)  ipratropium (ATROVENT) nebulizer solution 1 mg (1 mg Nebulization Given 03/31/22 0520)  methylPREDNISolone sodium succinate (SOLU-MEDROL) 125 mg/2 mL injection 125 mg (125 mg Intravenous Given 03/31/22 0537)  magnesium sulfate IVPB 2 g 50 mL (2 g Intravenous New Bag/Given 03/31/22 0542)  albuterol (PROVENTIL) (2.5 MG/3ML) 0.083% nebulizer solution 5 mg (5 mg Nebulization Given 03/31/22 0548)  ipratropium (ATROVENT) nebulizer solution 0.5 mg (0.5 mg Nebulization Given 03/31/22 0548)     ED COURSE:  6:30 AM  Pt now extremely well-appearing, smiling and speaking full sentences.  Vital signs have improved.  100% on room air.  Lungs clear to auscultation with good aeration now.  Labs unremarkable.  No leukocytosis,  normal hemoglobin, normal electrolytes, negative pregnancy test.  Chest x-ray reviewed and interpreted by myself and the radiologist and shows no acute abnormality.  COVID and flu pending but anticipate discharge home with inhaler and prednisone burst.  6:49 AM  Pt continues to be well-appearing with no hypoxia, increased work of breathing, now speaking full sentences without tachypnea.  COVID and flu negative.  Will discharge home.  Patient comfortable with this plan.  She does have a PCP for follow-up.   At this time, I do not feel there is any life-threatening condition present. I reviewed all nursing notes, vitals, pertinent previous records.  All lab and urine results, EKGs, imaging ordered have been independently reviewed and interpreted by myself.  I reviewed all available radiology reports from any imaging ordered this visit.  Based on my assessment, I feel the patient is safe to be  discharged home without further emergent workup and can continue workup as an outpatient as needed. Discussed all findings, treatment plan as well as usual and customary return precautions.  They verbalize understanding and are comfortable with this plan.  Outpatient follow-up has been provided as needed.  All questions have been answered.  CONSULTS:  none   OUTSIDE RECORDS REVIEWED: Reviewed patient's last pediatric office visit in May 2016.       FINAL CLINICAL IMPRESSION(S) / ED DIAGNOSES   Final diagnoses:  Asthma with acute exacerbation, unspecified asthma severity, unspecified whether persistent     Rx / DC Orders   ED Discharge Orders          Ordered    albuterol (VENTOLIN HFA) 108 (90 Base) MCG/ACT inhaler  Every 4 hours PRN        03/31/22 0633    predniSONE (DELTASONE) 20 MG tablet  Daily        03/31/22 [id number]             Note:  This document was prepared using Dragon voice recognition software and may include unintentional dictation errors.   Tunisha Ruland, Delice Bison, DO 03/31/22 [phone]

## 2022-04-01 ENCOUNTER — Telehealth: Payer: Self-pay

## 2022-04-01 NOTE — Telephone Encounter (Signed)
Transition Care Management Unsuccessful Follow-up Telephone Call  Date of discharge and from where:  Asthma with acute exacerbation, unspecified asthma severity, unspecified whether persistent   Attempts:  1st Attempt  Reason for unsuccessful TCM follow-up call:  Left voice message   Woodfin Ganja LPN [facility] Nurse Health Advisor Direct Dial [phone]

## 2022-04-02 ENCOUNTER — Telehealth: Payer: Self-pay

## 2022-04-02 NOTE — Telephone Encounter (Signed)
Transition Care Management Unsuccessful Follow-up Telephone Call  Date of discharge and from where:  [facility] ED 03/31/2022  Attempts:  2nd Attempt  Reason for unsuccessful TCM follow-up call:  Left voice message Karena Addison, LPN [facility] Nurse Health Advisor Direct Dial [phone]

## 2022-04-05 IMAGING — CR DG CHEST 2V
2 series · 2 of 2 positions shown · non-contrast
Comparison: None.

CLINICAL DATA: Chest pain, shortness of breath.

EXAM:
CHEST - 2 VIEW

[chest pa]
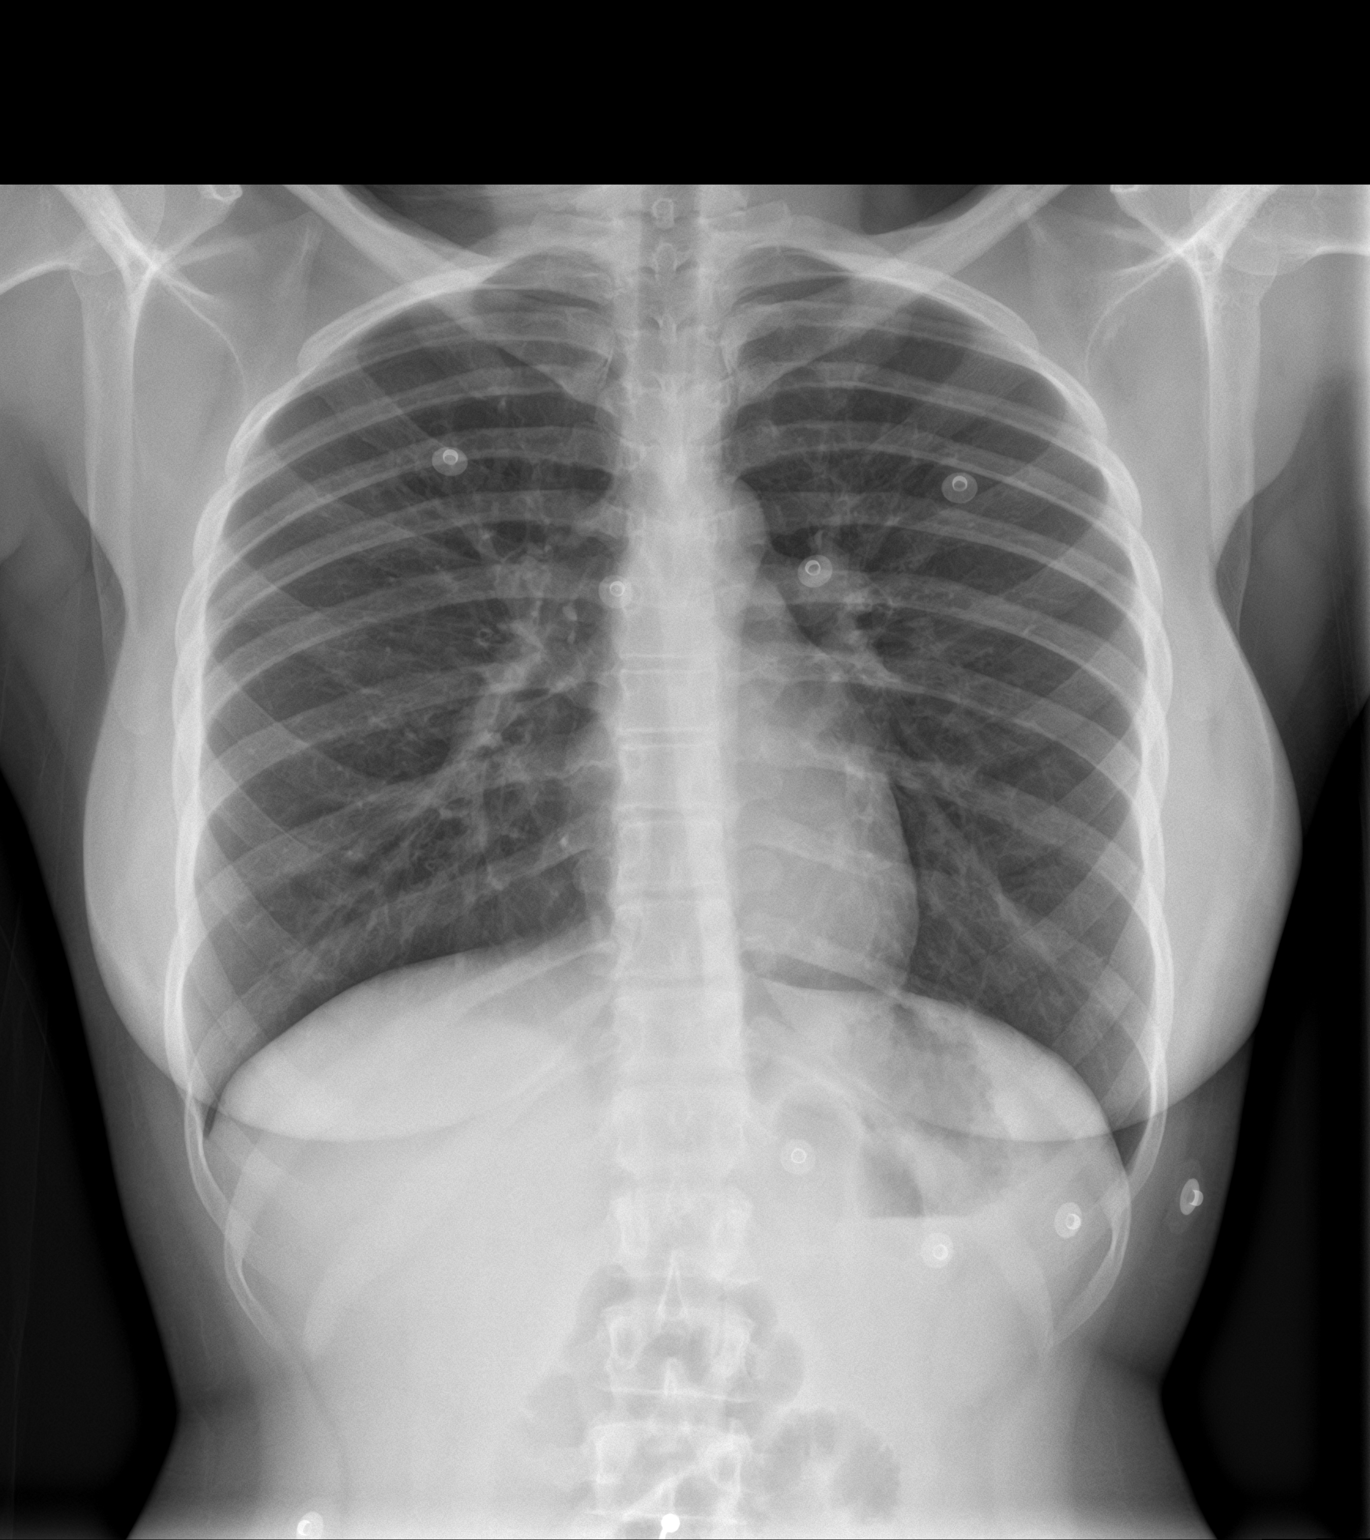

[chest lat]
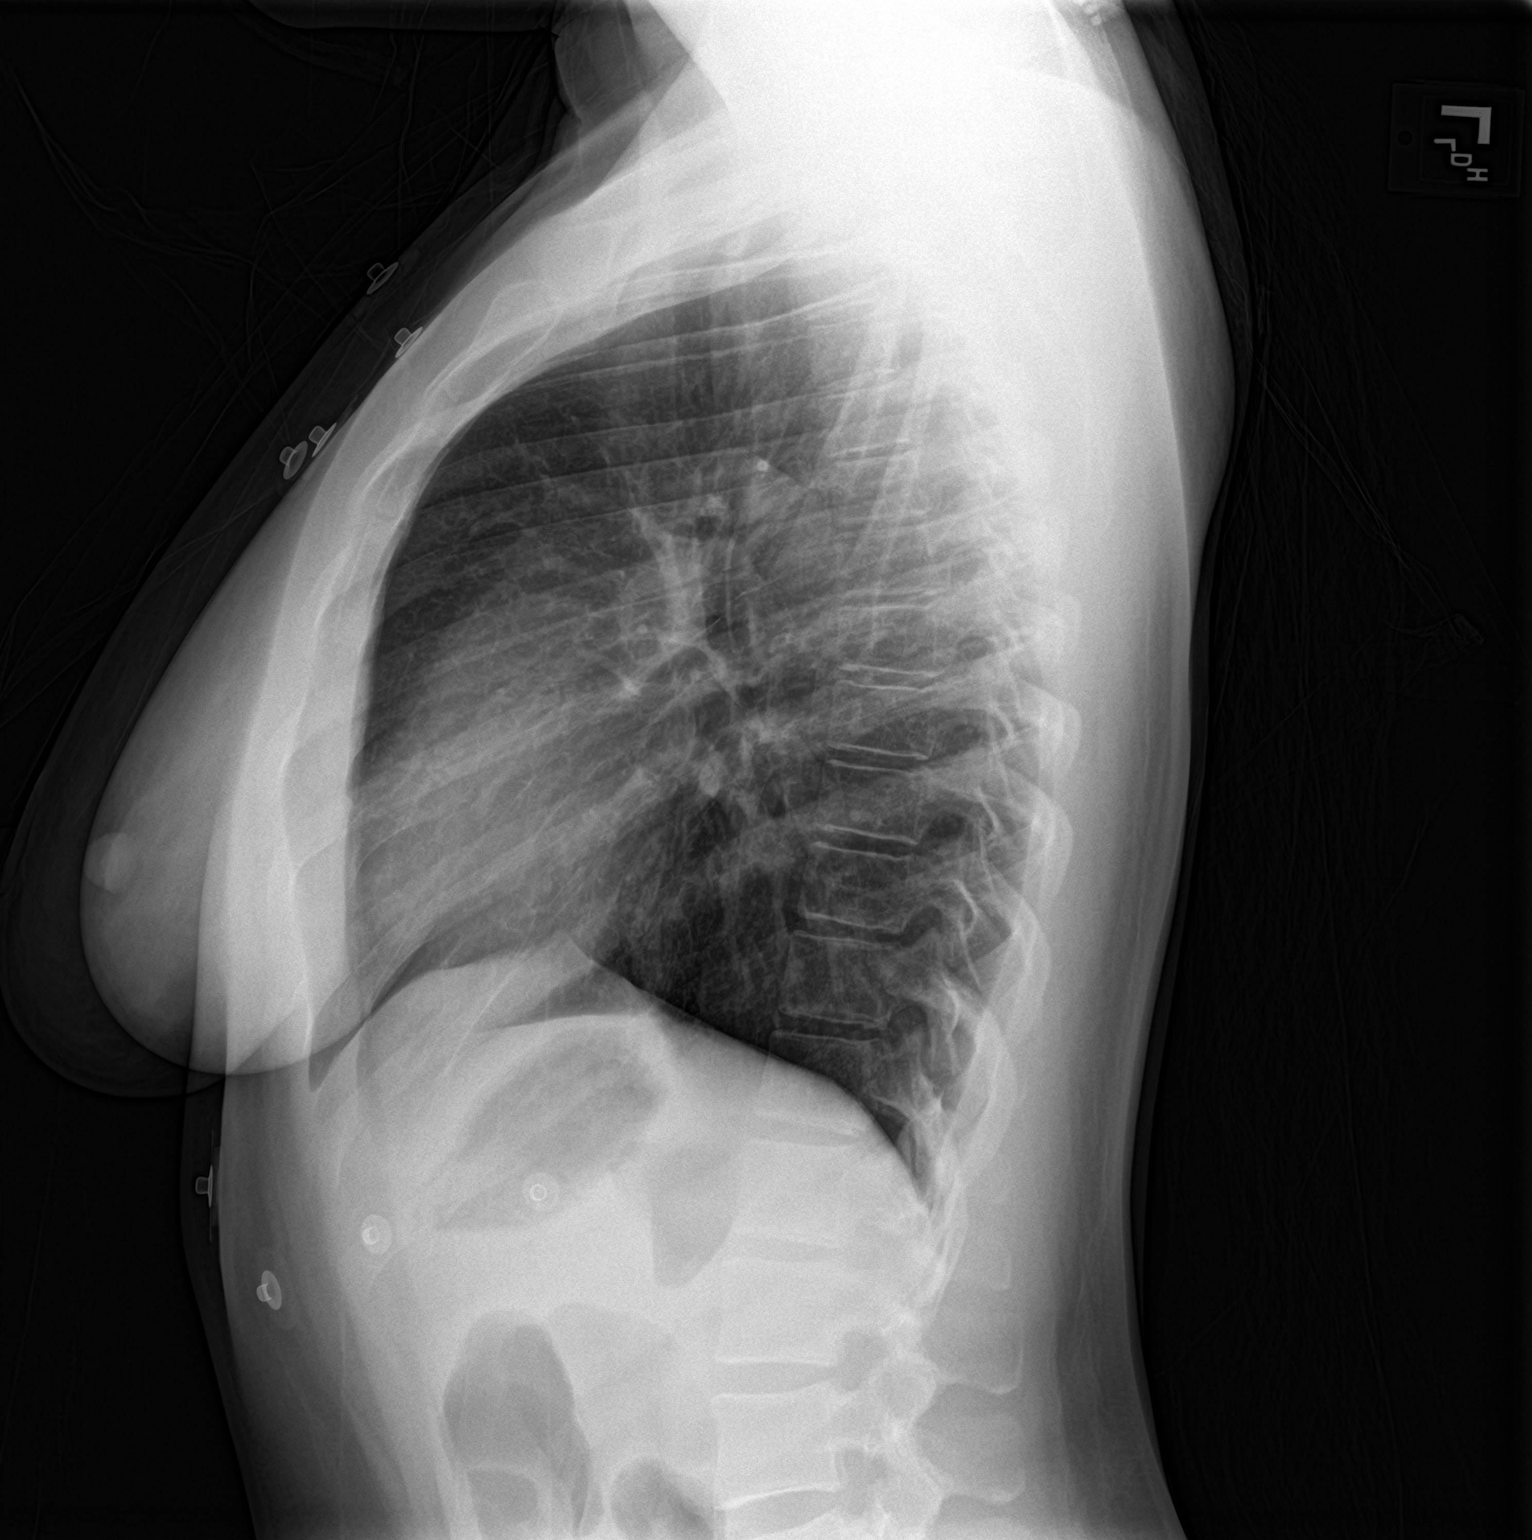

[2 of 2 positions shown; findings below may reference images not displayed]

FINDINGS: The heart size and mediastinal contours are within normal limits.
Both lungs are clear. The visualized skeletal structures are
unremarkable.
IMPRESSION: No active cardiopulmonary disease.

## 2022-04-05 NOTE — Telephone Encounter (Signed)
Transition Care Management Unsuccessful Follow-up Telephone Call  Date of discharge and from where:  [facility] 03/31/2022  Attempts:  3rd Attempt  Reason for unsuccessful TCM follow-up call:  Left voice message Karena Addison, LPN [facility] Nurse Health Advisor Direct Dial [phone]

## 2022-06-17 ENCOUNTER — Encounter: Payer: BC Managed Care – PPO | Admitting: Family Medicine

## 2022-09-09 ENCOUNTER — Ambulatory Visit (INDEPENDENT_AMBULATORY_CARE_PROVIDER_SITE_OTHER): Payer: BC Managed Care – PPO | Admitting: Family Medicine

## 2022-09-09 ENCOUNTER — Encounter: Payer: Self-pay | Admitting: Family Medicine

## 2022-09-09 VITALS — BP 108/76 | HR 78 | Ht 60.0 in | Wt 149.0 lb

## 2022-09-09 DIAGNOSIS — J4520 Mild intermittent asthma, uncomplicated: Secondary | ICD-10-CM

## 2022-09-09 MED ORDER — FLUTICASONE-SALMETEROL 100-50 MCG/ACT IN AEPB: 1.0000 | INHALATION_SPRAY | Freq: Two times a day (BID) | RESPIRATORY_TRACT | 3 refills | Status: DC

## 2022-09-09 MED ORDER — ALBUTEROL SULFATE HFA 108 (90 BASE) MCG/ACT IN AERS: 2.0000 | INHALATION_SPRAY | RESPIRATORY_TRACT | 2 refills | Status: DC | PRN

## 2022-09-09 NOTE — Progress Notes (Signed)
     Primary Care / Sports Medicine Office Visit  Patient Information:  Patient ID: Jeanne Steele, female DOB: 2000-10-30 Age: 22 y.o. MRN: [id number]   Jeanne Steele is a pleasant 22 y.o. female presenting with the following:  Chief Complaint  Patient presents with   Annual Exam    Vitals:   09/09/22 1441  BP: 108/76  Pulse: 78  SpO2: 98%   Vitals:   09/09/22 1441  Weight: 149 lb (67.6 kg)  Height: 5' (1.524 m)   Body mass index is 29.1 kg/m.  No results found.   Independent interpretation of notes and tests performed by another provider:   None  Procedures performed:   None  Pertinent History, Exam, Impression, and Recommendations:   Jeanne Steele was seen today for annual exam.  Mild intermittent asthma without complication Assessment & Plan: Uncontrolled, relying on rescue inhaler, worse with athletics.  Examination with equal air entry, faint wheeze expiratory, otherwise benign  - Start Adviar - Continue albuterol  - Escalate at return if indicated - Return for physical   Other orders -     Albuterol Sulfate HFA; Inhale 2 puffs into the lungs every 4 (four) hours as needed for wheezing or shortness of breath.  Dispense: 1 each; Refill: 2 -     Fluticasone-Salmeterol; Inhale 1 puff into the lungs 2 (two) times daily.  Dispense: 1 each; Refill: 3     Orders & Medications Meds ordered this encounter  Medications   albuterol (VENTOLIN HFA) 108 (90 Base) MCG/ACT inhaler    Sig: Inhale 2 puffs into the lungs every 4 (four) hours as needed for wheezing or shortness of breath.    Dispense:  1 each    Refill:  2   fluticasone-salmeterol (ADVAIR) 100-50 MCG/ACT AEPB    Sig: Inhale 1 puff into the lungs 2 (two) times daily.    Dispense:  1 each    Refill:  3   No orders of the defined types were placed in this encounter.    Return in about 4 weeks (around 10/07/2022) for 4-6 weeks CPE.     Jerrol Banana, MD, [facility]   Primary Care Sports Medicine Primary  Care and Sports Medicine at [facility]

## 2022-09-13 NOTE — Assessment & Plan Note (Signed)
Uncontrolled, relying on rescue inhaler, worse with athletics.  Examination with equal air entry, faint wheeze expiratory, otherwise benign  - Start Adviar - Continue albuterol  - Escalate at return if indicated - Return for physical

## 2022-10-19 ENCOUNTER — Encounter: Payer: BC Managed Care – PPO | Admitting: Family Medicine

## 2023-03-18 ENCOUNTER — Telehealth: Payer: Self-pay | Admitting: Family Medicine

## 2023-03-18 NOTE — Telephone Encounter (Signed)
Please schedule appt.  KP

## 2023-03-18 NOTE — Telephone Encounter (Signed)
Copied from CRM [id number]. Topic: General - Other >> Mar 18, 2023 11:09 AM Turkey B wrote: Reason for CRM: Pt wants to schedule HPV vaccine

## 2023-03-21 ENCOUNTER — Ambulatory Visit (INDEPENDENT_AMBULATORY_CARE_PROVIDER_SITE_OTHER): Payer: BC Managed Care – PPO

## 2023-03-21 ENCOUNTER — Telehealth: Payer: Self-pay | Admitting: Family Medicine

## 2023-03-21 DIAGNOSIS — Z23 Encounter for immunization: Secondary | ICD-10-CM | POA: Diagnosis not present

## 2023-03-21 NOTE — Telephone Encounter (Signed)
Noted  KP 

## 2023-03-21 NOTE — Telephone Encounter (Signed)
Copied from CRM [id number]. Topic: General - Inquiry >> Mar 21, 2023  8:59 AM De Blanch wrote: Reason for CRM:Pt is requesting a callback to be scheduled for a TB test. Pt is asking about possible cost.  Please advise.

## 2023-03-21 NOTE — Telephone Encounter (Signed)
Please schedule. Pt will need to call [facility] insurance should cover it. But call them to see the price.  KP

## 2023-03-28 ENCOUNTER — Telehealth: Payer: BC Managed Care – PPO | Admitting: Physician Assistant

## 2023-03-28 DIAGNOSIS — J4521 Mild intermittent asthma with (acute) exacerbation: Secondary | ICD-10-CM | POA: Diagnosis not present

## 2023-03-28 DIAGNOSIS — J069 Acute upper respiratory infection, unspecified: Secondary | ICD-10-CM | POA: Diagnosis not present

## 2023-03-28 MED ORDER — IPRATROPIUM BROMIDE 0.03 % NA SOLN: 2.0000 | Freq: Two times a day (BID) | NASAL | 0 refills | Status: DC

## 2023-03-28 MED ORDER — PREDNISONE 20 MG PO TABS: 40.0000 mg | ORAL_TABLET | Freq: Every day | ORAL | 0 refills | Status: DC

## 2023-03-28 MED ORDER — PSEUDOEPH-BROMPHEN-DM 30-2-10 MG/5ML PO SYRP: 5.0000 mL | ORAL_SOLUTION | Freq: Four times a day (QID) | ORAL | 0 refills | Status: DC | PRN

## 2023-03-28 NOTE — Patient Instructions (Signed)
Basilia Jumbo, thank you for joining Margaretann Loveless, PA-C for today's virtual visit.  While this provider is not your primary care provider (PCP), if your PCP is located in our provider database this encounter information will be shared with them immediately following your visit.   A [place] MyChart account gives you access to today's visit and all your visits, tests, and labs performed at [facility] " click here if you don't have a  MyChart account or go to [email or web address]  Consent: (Patient) Jeanne Steele provided verbal consent for this virtual visit at the beginning of the encounter.  Current Medications:  Current Outpatient Medications:    brompheniramine-pseudoephedrine-DM 30-2-10 MG/5ML syrup, Take 5 mLs by mouth 4 (four) times daily as needed., Disp: 120 mL, Rfl: 0   ipratropium (ATROVENT) 0.03 % nasal spray, Place 2 sprays into both nostrils every 12 (twelve) hours., Disp: 30 mL, Rfl: 0   predniSONE (DELTASONE) 20 MG tablet, Take 2 tablets (40 mg total) by mouth daily with breakfast., Disp: 10 tablet, Rfl: 0   albuterol (VENTOLIN HFA) 108 (90 Base) MCG/ACT inhaler, Inhale 2 puffs into the lungs every 4 (four) hours as needed for wheezing or shortness of breath., Disp: 1 each, Rfl: 2   fluticasone-salmeterol (ADVAIR) 100-50 MCG/ACT AEPB, Inhale 1 puff into the lungs 2 (two) times daily., Disp: 1 each, Rfl: 3   JUNEL FE 1.5/30 1.5-30 MG-MCG tablet, Take 1 tablet by mouth daily., Disp: , Rfl:    Medications ordered in this encounter:  Meds ordered this encounter  Medications   predniSONE (DELTASONE) 20 MG tablet    Sig: Take 2 tablets (40 mg total) by mouth daily with breakfast.    Dispense:  10 tablet    Refill:  0    Order Specific Question:   Supervising Provider    Answer:   Merrilee Jansky [[id number]]   ipratropium (ATROVENT) 0.03 % nasal spray    Sig: Place 2 sprays into both nostrils every 12 (twelve) hours.    Dispense:  30 mL     Refill:  0    Order Specific Question:   Supervising Provider    Answer:   Merrilee Jansky [id number]   brompheniramine-pseudoephedrine-DM 30-2-10 MG/5ML syrup    Sig: Take 5 mLs by mouth 4 (four) times daily as needed.    Dispense:  120 mL    Refill:  0    Order Specific Question:   Supervising Provider    Answer:   Merrilee Jansky [[id number]]     *If you need refills on other medications prior to your next appointment, please contact your pharmacy*  Follow-Up: Call back or seek an in-person evaluation if the symptoms worsen or if the condition fails to improve as anticipated.   Virtual Care [phone]  Other Instructions Upper Respiratory Infection, Adult An upper respiratory infection (URI) is a common viral infection of the nose, throat, and upper air passages that lead to the lungs. The most common type of URI is the common cold. URIs usually get better on their own, without medical treatment. What are the causes? A URI is caused by a virus. You may catch a virus by: Breathing in droplets from an infected person's cough or sneeze. Touching something that has been exposed to the virus (is contaminated) and then touching your mouth, nose, or eyes. What increases the risk? You are more likely to get a URI if: You are very young or very old. You have close  contact with others, such as at work, school, or a health care facility. You smoke. You have long-term (chronic) heart or lung disease. You have a weakened disease-fighting system (immune system). You have nasal allergies or asthma. You are experiencing a lot of stress. You have poor nutrition. What are the signs or symptoms? A URI usually involves some of the following symptoms: Runny or stuffy (congested) nose. Cough. Sneezing. Sore throat. Headache. Fatigue. Fever. Loss of appetite. Pain in your forehead, behind your eyes, and over your cheekbones (sinus pain). Muscle aches. Redness or irritation  of the eyes. Pressure in the ears or face. How is this diagnosed? This condition may be diagnosed based on your medical history and symptoms, and a physical exam. Your health care provider may use a swab to take a mucus sample from your nose (nasal swab). This sample can be tested to determine what virus is causing the illness. How is this treated? URIs usually get better on their own within 7-10 days. Medicines cannot cure URIs, but your health care provider may recommend certain medicines to help relieve symptoms, such as: Over-the-counter cold medicines. Cough suppressants. Coughing is a type of defense against infection that helps to clear the respiratory system, so take these medicines only as recommended by your health care provider. Fever-reducing medicines. Follow these instructions at home: Activity Rest as needed. If you have a fever, stay home from work or school until your fever is gone or until your health care provider says your URI cannot spread to other people (is no longer contagious). Your health care provider may have you wear a face mask to prevent your infection from spreading. Relieving symptoms Gargle with a mixture of salt and water 3-4 times a day or as needed. To make salt water, completely dissolve -1 tsp (3-6 g) of salt in 1 cup (237 mL) of warm water. Use a cool-mist humidifier to add moisture to the air. This can help you breathe more easily. Eating and drinking  Drink enough fluid to keep your urine pale yellow. Eat soups and other clear broths. General instructions  Take over-the-counter and prescription medicines only as told by your health care provider. These include cold medicines, fever reducers, and cough suppressants. Do not use any products that contain nicotine or tobacco. These products include cigarettes, chewing tobacco, and vaping devices, such as e-cigarettes. If you need help quitting, ask your health care provider. Stay away from secondhand  smoke. Stay up to date on all immunizations, including the yearly (annual) flu vaccine. Keep all follow-up visits. This is important. How to prevent the spread of infection to others URIs can be contagious. To prevent the infection from spreading: Wash your hands with soap and water for at least 20 seconds. If soap and water are not available, use hand sanitizer. Avoid touching your mouth, face, eyes, or nose. Cough or sneeze into a tissue or your sleeve or elbow instead of into your hand or into the air.  Contact a health care provider if: You are getting worse instead of better. You have a fever or chills. Your mucus is brown or red. You have yellow or brown discharge coming from your nose. You have pain in your face, especially when you bend forward. You have swollen neck glands. You have pain while swallowing. You have white areas in the back of your throat. Get help right away if: You have shortness of breath that gets worse. You have severe or persistent: Headache. Ear pain. Sinus pain. Chest  pain. You have chronic lung disease along with any of the following: Making high-pitched whistling sounds when you breathe, most often when you breathe out (wheezing). Prolonged cough (more than 14 days). Coughing up blood. A change in your usual mucus. You have a stiff neck. You have changes in your: Vision. Hearing. Thinking. Mood. These symptoms may be an emergency. Get help right away. Call 911. Do not wait to see if the symptoms will go away. Do not drive yourself to the hospital. Summary An upper respiratory infection (URI) is a common infection of the nose, throat, and upper air passages that lead to the lungs. A URI is caused by a virus. URIs usually get better on their own within 7-10 days. Medicines cannot cure URIs, but your health care provider may recommend certain medicines to help relieve symptoms. This information is not intended to replace advice given to you by  your health care provider. Make sure you discuss any questions you have with your health care provider. Document Revised: 12/03/2020 Document Reviewed: 12/03/2020 Elsevier Patient Education  2024 Elsevier Inc.    If you have been instructed to have an in-person evaluation today at a local Urgent Care facility, please use the link below. It will take you to a list of all of our available [place] [facility], including address, phone number and hours of operation. Please do not delay care.  [facility]  If you or a family member do not have a primary care provider, use the link below to schedule a visit and establish care. When you choose a [place] primary care physician or advanced practice provider, you gain a long-term partner in health. Find a Primary Care Provider  Learn more about [facility]'s in-office and virtual care options: [place] - Get Care Now

## 2023-03-28 NOTE — Progress Notes (Signed)
Virtual Visit Consent   Jeanne Steele, you are scheduled for a virtual visit with a [facility] provider today. Just as with appointments in the office, your consent must be obtained to participate. Your consent will be active for this visit and any virtual visit you may have with one of our providers in the next 365 days. If you have a MyChart account, a copy of this consent can be sent to you electronically.  As this is a virtual visit, video technology does not allow for your provider to perform a traditional examination. This may limit your provider's ability to fully assess your condition. If your provider identifies any concerns that need to be evaluated in person or the need to arrange testing (such as labs, EKG, etc.), we will make arrangements to do so. Although advances in technology are sophisticated, we cannot ensure that it will always work on either your end or our end. If the connection with a video visit is poor, the visit may have to be switched to a telephone visit. With either a video or telephone visit, we are not always able to ensure that we have a secure connection.  By engaging in this virtual visit, you consent to the provision of healthcare and authorize for your insurance to be billed (if applicable) for the services provided during this visit. Depending on your insurance coverage, you may receive a charge related to this service.  I need to obtain your verbal consent now. Are you willing to proceed with your visit today? Jeanne Steele has provided verbal consent on 03/28/2023 for a virtual visit (video or telephone). Margaretann Loveless, PA-C  Date: 03/28/2023 11:29 AM  Virtual Visit via Video Note   I, Margaretann Loveless, connected with  Jeanne Steele  ([id number], 10-19-2000) on 03/28/23 at 11:15 AM EST by a video-enabled telemedicine application and verified that I am speaking with the correct person using two identifiers.  Location: Patient: Virtual Visit Location  Patient: Home Provider: Virtual Visit Location Provider: Home Office   I discussed the limitations of evaluation and management by telemedicine and the availability of in person appointments. The patient expressed understanding and agreed to proceed.    History of Present Illness: Jeanne Steele is a 22 y.o. who identifies as a female who was assigned female at birth, and is being seen today for URI symptoms.  HPI: URI  This is a new problem. The current episode started in the past 7 days (Started Friday). The problem has been unchanged. There has been no fever. Associated symptoms include congestion, coughing, headaches (mild), a plugged ear sensation (bilateral), rhinorrhea, sinus pain (over right maxillary sinus; feels from blowing nose), a sore throat (dry, scratchy) and wheezing (PMH asthma). Pertinent negatives include no chest pain, diarrhea, ear pain, nausea or vomiting. She has tried antihistamine and inhaler use (benadryl, albuterol) for the symptoms. The treatment provided no relief.     Problems:  Patient Active Problem List   Diagnosis Date Noted   Annual physical exam 06/16/2021   Mild intermittent asthma 05/13/2021    Allergies: No Known Allergies Medications:  Current Outpatient Medications:    brompheniramine-pseudoephedrine-DM 30-2-10 MG/5ML syrup, Take 5 mLs by mouth 4 (four) times daily as needed., Disp: 120 mL, Rfl: 0   ipratropium (ATROVENT) 0.03 % nasal spray, Place 2 sprays into both nostrils every 12 (twelve) hours., Disp: 30 mL, Rfl: 0   predniSONE (DELTASONE) 20 MG tablet, Take 2 tablets (40 mg total) by mouth daily with breakfast., Disp:  10 tablet, Rfl: 0   albuterol (VENTOLIN HFA) 108 (90 Base) MCG/ACT inhaler, Inhale 2 puffs into the lungs every 4 (four) hours as needed for wheezing or shortness of breath., Disp: 1 each, Rfl: 2   fluticasone-salmeterol (ADVAIR) 100-50 MCG/ACT AEPB, Inhale 1 puff into the lungs 2 (two) times daily., Disp: 1 each, Rfl: 3   JUNEL FE  1.5/30 1.5-30 MG-MCG tablet, Take 1 tablet by mouth daily., Disp: , Rfl:   Observations/Objective: Patient is well-developed, well-nourished in no acute distress.  Resting comfortably at home.  Head is normocephalic, atraumatic.  No labored breathing.  Speech is clear and coherent with logical content.  Patient is alert and oriented at baseline.    Assessment and Plan: 1. Viral URI with cough - predniSONE (DELTASONE) 20 MG tablet; Take 2 tablets (40 mg total) by mouth daily with breakfast.  Dispense: 10 tablet; Refill: 0 - ipratropium (ATROVENT) 0.03 % nasal spray; Place 2 sprays into both nostrils every 12 (twelve) hours.  Dispense: 30 mL; Refill: 0 - brompheniramine-pseudoephedrine-DM 30-2-10 MG/5ML syrup; Take 5 mLs by mouth 4 (four) times daily as needed.  Dispense: 120 mL; Refill: 0  2. Mild intermittent asthma with exacerbation - predniSONE (DELTASONE) 20 MG tablet; Take 2 tablets (40 mg total) by mouth daily with breakfast.  Dispense: 10 tablet; Refill: 0  - Suspect viral URI - Symptomatic medications of choice over the counter as needed - Bromfed DM for cough - Prednisone for asthma exacerbation - Ipratropium bromide nasal spray for congestion and drainage - Continue Albuterol - Continue Humidifier - Push fluids - Rest - Seek further evaluation if symptoms change or worsen   Follow Up Instructions: I discussed the assessment and treatment plan with the patient. The patient was provided an opportunity to ask questions and all were answered. The patient agreed with the plan and demonstrated an understanding of the instructions.  A copy of instructions were sent to the patient via MyChart unless otherwise noted below.    The patient was advised to call back or seek an in-person evaluation if the symptoms worsen or if the condition fails to improve as anticipated.    Margaretann Loveless, PA-C

## 2023-03-29 ENCOUNTER — Other Ambulatory Visit: Payer: Self-pay

## 2023-07-10 ENCOUNTER — Telehealth: Payer: 59

## 2023-07-23 ENCOUNTER — Telehealth: Admitting: Nurse Practitioner

## 2023-07-23 DIAGNOSIS — J4521 Mild intermittent asthma with (acute) exacerbation: Secondary | ICD-10-CM | POA: Diagnosis not present

## 2023-07-23 MED ORDER — PREDNISONE 20 MG PO TABS: 20.0000 mg | ORAL_TABLET | Freq: Every day | ORAL | 0 refills | Status: AC

## 2023-07-23 MED ORDER — ALBUTEROL SULFATE HFA 108 (90 BASE) MCG/ACT IN AERS: 2.0000 | INHALATION_SPRAY | RESPIRATORY_TRACT | 2 refills | Status: DC | PRN

## 2023-07-23 MED ORDER — FLUTICASONE-SALMETEROL 100-50 MCG/ACT IN AEPB: 1.0000 | INHALATION_SPRAY | Freq: Two times a day (BID) | RESPIRATORY_TRACT | 0 refills | Status: AC

## 2023-07-23 NOTE — Progress Notes (Signed)
 Please follow up with your PCP for an office visit for additional refills. We have filled a month supply at this time to give you enough time to see your PCP Dr Ashley Royalty                                                                                                                   E-Visit for Asthma  Based on what you have shared with me, it looks like you may have a flare up of your asthma.  Asthma is a chronic (ongoing) lung disease which results in airway obstruction, inflammation and hyper-responsiveness.   Asthma symptoms vary from person to person, with common symptoms including nighttime awakening and decreased ability to participate in normal activities as a result of shortness of breath. It is often triggered by changes in weather, changes in the season, changes in air temperature, or inside (home, school, daycare or work) allergens such as animal dander, mold, mildew, woodstoves or cockroaches.   It can also be triggered by hormonal changes, extreme emotion, physical exertion or an upper respiratory tract illness.     It is important to identify the trigger, and then eliminate or avoid the trigger if possible.   If you have been prescribed medications to be taken on a regular basis, it is important to follow the asthma action plan and to follow guidelines to adjust medication in response to increasing symptoms of decreased peak expiratory flow rate  Treatment: I have refilled your asthma medications at this time.   HOME CARE Only take medications as instructed by your medical team. Consider wearing a mask or scarf to improve breathing air temperature have been shown to decrease irritation and decrease exacerbations Get rest. Taking a steamy shower or using a humidifier may help nasal congestion sand ease sore throat pain. You can place a towel over your head and breathe in the steam from hot water coming from a faucet. Using a saline nasal spray works much the same way.  Cough  drops, hare candies and sore throat lozenges may ease your cough.  Avoid close contacts especially the very you and the elderly Cover your mouth if you cough or sneeze Always remember to wash your hands.    GET HELP RIGHT AWAY IF: You develop worsening symptoms; breathlessness at rest, drowsy, confused or agitated, unable to speak in full sentences You have coughing fits You develop a severe headache or visual changes You develop shortness of breath, difficulty breathing or start having chest pain Your symptoms persist after you have completed your treatment plan If your symptoms do not improve within 10 days  MAKE SURE YOU Understand these instructions. Will watch your condition. Will get help right away if you are not doing well or get worse.   Your e-visit answers were reviewed by a board certified advanced clinical practitioner to complete your personal care plan, Depending upon the condition, your plan could have included both over the counter or prescription medications.   Please review your pharmacy choice.  Your safety is important to [place]. If you have drug allergies check your prescription carefully.  You can use MyChart to ask questions about today's visit, request a non-urgent  call back, or ask for a work or school excuse for 24 hours related to this e-Visit. If it has been greater than 24 hours you will need to follow up with your provider, or enter a new e-Visit to address those concerns.   You will get an e-mail in the next two days asking about your experience. I hope that your e-visit has been valuable and will speed your recovery. Thank you for using e-visits.

## 2023-07-23 NOTE — Progress Notes (Signed)
 I have spent 5 minutes in review of e-visit questionnaire, review and updating patient chart, medical decision making and response to patient.   Claiborne Rigg, NP

## 2023-12-16 ENCOUNTER — Ambulatory Visit: Payer: Self-pay

## 2023-12-16 ENCOUNTER — Encounter: Payer: Self-pay | Admitting: Nurse Practitioner

## 2023-12-16 DIAGNOSIS — Z113 Encounter for screening for infections with a predominantly sexual mode of transmission: Secondary | ICD-10-CM

## 2023-12-16 LAB — WET PREP FOR TRICH, YEAST, CLUE
Clue Cell Exam: NEGATIVE
Trichomonas Exam: NEGATIVE
Yeast Exam: NEGATIVE

## 2023-12-16 LAB — HM HIV SCREENING LAB: HM HIV Screening: NEGATIVE

## 2023-12-16 NOTE — Progress Notes (Signed)
 In house lab results reviewed during visit. Gery Kubas RN

## 2023-12-16 NOTE — Progress Notes (Signed)
 [facility] Department STI clinic [address], [address] [place] [address] Main phone: [phone]  STI screening visit  Subjective:  Jeanne Steele is a 23 y.o. female being seen today for an STI screening visit. The patient reports they do not have symptoms.  Patient reports that they do not desire a pregnancy in the next year.   They reported they are not interested in discussing contraception today.    No LMP recorded. (Menstrual status: Oral contraceptives).  Patient has the following medical conditions:  Patient Active Problem List   Diagnosis Date Noted   Annual physical exam 06/16/2021   Mild intermittent asthma 05/13/2021   Chief Complaint  Patient presents with   STI Screen    HPI Patient reports no symptoms  Does the patient using douching products? No  See flowsheet for further details and programmatic requirements Hyperlink available at the top of the signed note in blue.  Flow sheet content below:  Pregnancy Intention Screening Does the patient want to become pregnant in the next year?: No Does the patient's partner want to become pregnant in the next year?: No Would the patient like to discuss contraceptive options today?: No All Patients Anyone smoke around pt and/or pt's children?: No Anyone smoke inside pt's house?: No Anyone smoke inside car?: No Anyone smoke inside the workplace?: No Reason For STD Screen STD Screening: Is asymptomatic Have you ever had an STD?: Yes History of Antibiotic use in the past 2 weeks?: No STD Symptoms Denies all: No Assess # of cigarettes per day: 0 Abuse History Has patient ever been abused physically?: No Has patient ever been abused sexually?: No Does patient feel they have a problem with Anxiety?: No Does patient feel they have a problem with Depression?: No   Screening for MPX risk: Does the patient have an unexplained rash? No Is the patient MSM? No Does the patient endorse  multiple sex partners or anonymous sex partners? No Did the patient have close or sexual contact with a person diagnosed with MPX? No Has the patient traveled outside the [place]  where MPX is endemic? No Is there a high clinical suspicion for MPX-- evidenced by one of the following No  -Unlikely to be chickenpox  -Lymphadenopathy  -Rash that present in same phase of evolution on any given body part  Screenings: Last HIV test per patient/review of record was No results found for: HMHIVSCREEN  Lab Results  Component Value Date   HIV Non Reactive 06/16/2021     Last HEPC test per patient/review of record was No results found for: HMHEPCSCREEN No components found for: HEPC   Last HEPB test per patient/review of record was No components found for: HMHEPBSCREEN   Patient reports last pap was:   No results found for: SPECADGYN No Cervical Cancer Screening results to display.  Immunization history:  Immunization History  Administered Date(s) Administered   Influenza, Seasonal, Injecte, Preservative Fre 03/21/2023   Moderna Sars-Covid-2 Vaccination 10/21/2020, 01/24/2021    The following portions of the patient's history were reviewed and updated as appropriate: allergies, current medications, past medical history, past social history, past surgical history and problem list.  Objective:  There were no vitals filed for this visit.  Physical Exam Vitals and nursing note reviewed.  Constitutional:      Appearance: Normal appearance.  HENT:     Head: Normocephalic.     Comments: Collected oral swab    Mouth/Throat:     Mouth: Mucous membranes are moist.  Cardiovascular:     Rate and Rhythm: Normal rate.  Pulmonary:     Effort: Pulmonary effort is normal.  Abdominal:     Palpations: Abdomen is soft.  Genitourinary:    Comments: Declined genital exam- no symptoms, self swabbed Musculoskeletal:        General: Normal range of motion.  Lymphadenopathy:     Head:     Right  side of head: No submandibular, preauricular or posterior auricular adenopathy.     Left side of head: No submandibular, preauricular or posterior auricular adenopathy.     Cervical: No cervical adenopathy.     Upper Body:     Right upper body: No supraclavicular or axillary adenopathy.     Left upper body: No supraclavicular or axillary adenopathy.  Skin:    General: Skin is warm and dry.  Neurological:     Mental Status: She is alert and oriented to person, place, and time.  Psychiatric:        Mood and Affect: Mood normal.        Behavior: Behavior normal.      Assessment and Plan:  Jeanne Steele is a 23 y.o. female presenting to the [facility] Department for STI screening  1. Screening for venereal disease (Primary) Self swabbed  - Chlamydia/Gonorrhea [place] [facility] - HIV [place] [facility] - Syphilis Serology, [place] [facility] - WET PREP FOR TRICH, YEAST, CLUE - Gonococcus culture    Patient accepted the following screenings: oral GC culture, vaginal CT/GC swab, vaginal wet prep, HIV, and RPR Patient meets criteria for HepB screening? No. Ordered? no Patient meets criteria for HepC screening? No. Ordered? no  Treat wet prep per standing order Discussed time line for [facility] results and that patient will be called with positive results and encouraged patient to call if she had not heard in 2 weeks.  Counseled to return or seek care for continued or worsening symptoms Recommended repeat testing in 3 months with positive results. Recommended condom use with all sex for STI prevention.   Patient is currently using nothing to prevent pregnancy.    Return in about 3 months (around 03/17/2024), or if symptoms worsen or fail to improve.  No future appointments.  Laurin Paulo K Rogene Meth, NP

## 2023-12-19 ENCOUNTER — Ambulatory Visit: Payer: Self-pay

## 2023-12-20 LAB — GONOCOCCUS CULTURE

## 2023-12-23 ENCOUNTER — Encounter: Payer: Self-pay | Admitting: Nurse Practitioner

## 2024-03-19 ENCOUNTER — Other Ambulatory Visit: Payer: Self-pay | Admitting: Nurse Practitioner

## 2024-03-19 DIAGNOSIS — J4521 Mild intermittent asthma with (acute) exacerbation: Secondary | ICD-10-CM

## 2024-03-20 ENCOUNTER — Telehealth: Admitting: Physician Assistant

## 2024-03-20 DIAGNOSIS — J069 Acute upper respiratory infection, unspecified: Secondary | ICD-10-CM | POA: Diagnosis not present

## 2024-03-20 DIAGNOSIS — J4521 Mild intermittent asthma with (acute) exacerbation: Secondary | ICD-10-CM | POA: Diagnosis not present

## 2024-03-20 MED ORDER — BENZONATATE 100 MG PO CAPS: 100.0000 mg | ORAL_CAPSULE | Freq: Three times a day (TID) | ORAL | 0 refills | Status: AC | PRN

## 2024-03-20 MED ORDER — PREDNISONE 20 MG PO TABS: 40.0000 mg | ORAL_TABLET | Freq: Every day | ORAL | 0 refills | Status: DC

## 2024-03-20 MED ORDER — LEVOCETIRIZINE DIHYDROCHLORIDE 5 MG PO TABS: 5.0000 mg | ORAL_TABLET | Freq: Every evening | ORAL | 0 refills | Status: AC

## 2024-03-20 NOTE — Patient Instructions (Signed)
  Kirston Swickard, thank you for joining Elsie Velma Lunger, PA-C for today's virtual visit.  While this provider is not your primary care provider (PCP), if your PCP is located in our provider database this encounter information will be shared with them immediately following your visit.   A [facility] MyChart account gives you access to today's visit and all your visits, tests, and labs performed at [facility]  click here if you don't have a [facility] MyChart account or go to [email or web address]  Consent: (Patient) Jeanne Steele provided verbal consent for this virtual visit at the beginning of the encounter.  Current Medications:  Current Outpatient Medications:    albuterol  (VENTOLIN  HFA) 108 (90 Base) MCG/ACT inhaler, Inhale 2 puffs into the lungs every 4 (four) hours as needed for wheezing or shortness of breath., Disp: 1 each, Rfl: 2   brompheniramine-pseudoephedrine-DM 30-2-10 MG/5ML syrup, Take 5 mLs by mouth 4 (four) times daily as needed., Disp: 120 mL, Rfl: 0   fluticasone -salmeterol (ADVAIR) 100-50 MCG/ACT AEPB, Inhale 1 puff into the lungs 2 (two) times daily. (Patient not taking: Reported on 12/16/2023), Disp: 60 each, Rfl: 0   ipratropium (ATROVENT ) 0.03 % nasal spray, Place 2 sprays into both nostrils every 12 (twelve) hours. (Patient not taking: Reported on 12/16/2023), Disp: 30 mL, Rfl: 0   JUNEL FE 1.5/30 1.5-30 MG-MCG tablet, Take 1 tablet by mouth daily. (Patient not taking: Reported on 12/16/2023), Disp: , Rfl:    Medications ordered in this encounter:  No orders of the defined types were placed in this encounter.    *If you need refills on other medications prior to your next appointment, please contact your pharmacy*  Follow-Up: Call back or seek an in-person evaluation if the symptoms worsen or if the condition fails to improve as anticipated.  [facility] [phone]  Other Instructions Asthma Triggers and Exacerbation In this video,  you will learn about exposures and other factors that can lead to asthma attacks and trouble controlling asthma. To view the content, go to this web address: [email or web address]  This video will expire on: 04/27/2025. If you need access to this video following this date, please reach out to the healthcare provider who assigned it to you. This information is not intended to replace advice given to you by your health care provider. Make sure you discuss any questions you have with your health care provider. Elsevier Patient Education  2024 [employer]   If you have been instructed to have an in-person evaluation today at a local Urgent Care facility, please use the link below. It will take you to a list of all of our available [facility], including address, phone number and hours of operation. Please do not delay care.  [facility]  If you or a family member do not have a primary care provider, use the link below to schedule a visit and establish care. When you choose a [facility] primary care physician or advanced practice provider, you gain a long-term partner in health. Find a Primary Care Provider  Learn more about [facility]'s in-office and virtual care options: [facility] - Get Care Now

## 2024-03-20 NOTE — Progress Notes (Signed)
 Virtual Visit Consent   Jeanne Steele, you are scheduled for a virtual visit with a [facility] provider today. Just as with appointments in the office, your consent must be obtained to participate. Your consent will be active for this visit and any virtual visit you may have with one of our providers in the next 365 days. If you have a MyChart account, a copy of this consent can be sent to you electronically.  As this is a virtual visit, video technology does not allow for your provider to perform a traditional examination. This may limit your provider's ability to fully assess your condition. If your provider identifies any concerns that need to be evaluated in person or the need to arrange testing (such as labs, EKG, etc.), we will make arrangements to do so. Although advances in technology are sophisticated, we cannot ensure that it will always work on either your end or our end. If the connection with a video visit is poor, the visit may have to be switched to a telephone visit. With either a video or telephone visit, we are not always able to ensure that we have a secure connection.  By engaging in this virtual visit, you consent to the provision of healthcare and authorize for your insurance to be billed (if applicable) for the services provided during this visit. Depending on your insurance coverage, you may receive a charge related to this service.  I need to obtain your verbal consent now. Are you willing to proceed with your visit today? Jeanne Steele has provided verbal consent on 03/20/2024 for a virtual visit (video or telephone). Jeanne Steele, [place]  Date: 03/20/2024 8:49 AM   Virtual Visit via Video Note   I, Jeanne Steele, connected with  Jeanne Steele  ([id number], March 26, 2001) on 03/20/24 at  7:45 AM EST by a video-enabled telemedicine application and verified that I am speaking with the correct person using two identifiers.  Location: Patient: Virtual Visit Location Patient:  Home Provider: Virtual Visit Location Provider: Home Office   I discussed the limitations of evaluation and management by telemedicine and the availability of in person appointments. The patient expressed understanding and agreed to proceed.    History of Present Illness: Jeanne Steele is a 23 y.o. who identifies as a female who was assigned female at birth, and is being seen today for URI symptoms and increased asthma symptoms over past couple of days. Notes Sunday night starting to have some scratchy throat. By the next morning noting congestion, cough and chest tightness. Used her albuterol  and went back to sleep. Still noting increased chest tightness and wheezing despite albuterol . Less nasal congestion but some residual chest congestion. Denies fever, chills or aches.  OTC -- Theraflu, Cold/Flu  HPI: HPI  Problems:  Patient Active Problem List   Diagnosis Date Noted   Annual physical exam 06/16/2021   Mild intermittent asthma 05/13/2021    Allergies: No Known Allergies Medications:  Current Outpatient Medications:    benzonatate (TESSALON) 100 MG capsule, Take 1 capsule (100 mg total) by mouth 3 (three) times daily as needed for cough., Disp: 30 capsule, Rfl: 0   levocetirizine (XYZAL) 5 MG tablet, Take 1 tablet (5 mg total) by mouth every evening., Disp: 30 tablet, Rfl: 0   predniSONE  (DELTASONE ) 20 MG tablet, Take 2 tablets (40 mg total) by mouth daily with breakfast., Disp: 10 tablet, Rfl: 0   albuterol  (VENTOLIN  HFA) 108 (90 Base) MCG/ACT inhaler, Inhale 2 puffs into the lungs every  4 (four) hours as needed for wheezing or shortness of breath., Disp: 1 each, Rfl: 2   fluticasone -salmeterol (ADVAIR) 100-50 MCG/ACT AEPB, Inhale 1 puff into the lungs 2 (two) times daily. (Patient not taking: Reported on 12/16/2023), Disp: 60 each, Rfl: 0   JUNEL FE 1.5/30 1.5-30 MG-MCG tablet, Take 1 tablet by mouth daily. (Patient not taking: Reported on 12/16/2023), Disp: , Rfl:    Observations/Objective: Patient is well-developed, well-nourished in no acute distress.  Resting comfortably at home.  Head is normocephalic, atraumatic.  No labored breathing. Faint expiratory wheeze audible on exam. Speech is clear and coherent with logical content.  Patient is alert and oriented at baseline.   Assessment and Plan: 1. Mild intermittent asthma with exacerbation (Primary) - predniSONE  (DELTASONE ) 20 MG tablet; Take 2 tablets (40 mg total) by mouth daily with breakfast.  Dispense: 10 tablet; Refill: 0  2. Viral URI with cough - levocetirizine (XYZAL) 5 MG tablet; Take 1 tablet (5 mg total) by mouth every evening.  Dispense: 30 tablet; Refill: 0 - benzonatate (TESSALON) 100 MG capsule; Take 1 capsule (100 mg total) by mouth 3 (three) times daily as needed for cough.  Dispense: 30 capsule; Refill: 0  Rx Prednisone  5-day burst along with Xyzal and Tessalon.  Increase fluids.  Rest.  Saline nasal spray.  Probiotic.  Mucinex as directed.  Humidifier in bedroom..  Call or return to clinic if symptoms are not improving.   Follow Up Instructions: I discussed the assessment and treatment plan with the patient. The patient was provided an opportunity to ask questions and all were answered. The patient agreed with the plan and demonstrated an understanding of the instructions.  A copy of instructions were sent to the patient via MyChart unless otherwise noted below.   The patient was advised to call back or seek an in-person evaluation if the symptoms worsen or if the condition fails to improve as anticipated.    Jeanne Velma Lunger, PA-C

## 2024-04-03 ENCOUNTER — Other Ambulatory Visit: Payer: Self-pay | Admitting: Nurse Practitioner

## 2024-04-03 DIAGNOSIS — J4521 Mild intermittent asthma with (acute) exacerbation: Secondary | ICD-10-CM

## 2024-06-09 ENCOUNTER — Telehealth: Admitting: Nurse Practitioner

## 2024-06-09 DIAGNOSIS — J4521 Mild intermittent asthma with (acute) exacerbation: Secondary | ICD-10-CM | POA: Diagnosis not present

## 2024-06-09 MED ORDER — PREDNISONE 20 MG PO TABS: 20.0000 mg | ORAL_TABLET | Freq: Every day | ORAL | 0 refills | Status: AC

## 2024-06-09 MED ORDER — ALBUTEROL SULFATE HFA 108 (90 BASE) MCG/ACT IN AERS: 2.0000 | INHALATION_SPRAY | RESPIRATORY_TRACT | 0 refills | Status: AC | PRN

## 2024-06-09 NOTE — Progress Notes (Signed)
 " Virtual Visit Consent   Jeanne Steele, you are scheduled for a virtual visit with a [facility] provider today. Just as with appointments in the office, your consent must be obtained to participate. Your consent will be active for this visit and any virtual visit you may have with one of our providers in the next 365 days. If you have a MyChart account, a copy of this consent can be sent to you electronically.  As this is a virtual visit, video technology does not allow for your provider to perform a traditional examination. This may limit your provider's ability to fully assess your condition. If your provider identifies any concerns that need to be evaluated in person or the need to arrange testing (such as labs, EKG, etc.), we will make arrangements to do so. Although advances in technology are sophisticated, we cannot ensure that it will always work on either your end or our end. If the connection with a video visit is poor, the visit may have to be switched to a telephone visit. With either a video or telephone visit, we are not always able to ensure that we have a secure connection.  By engaging in this virtual visit, you consent to the provision of healthcare and authorize for your insurance to be billed (if applicable) for the services provided during this visit. Depending on your insurance coverage, you may receive a charge related to this service.  I need to obtain your verbal consent now. Are you willing to proceed with your visit today? Jeanne Steele has provided verbal consent on 06/09/2024 for a virtual visit (video or telephone). Haze LELON Servant, NP  Date: 06/09/2024 4:33 PM   Virtual Visit via Video Note   I, Haze LELON Servant, connected with  Jeanne Steele  ([id number], 04-03-2001) on 06/09/24 at  4:30 PM EST by a video-enabled telemedicine application and verified that I am speaking with the correct person using two identifiers.  Location: Patient: Virtual Visit Location Patient:  Home Provider: Virtual Visit Location Provider: Home Office   I discussed the limitations of evaluation and management by telemedicine and the availability of in person appointments. The patient expressed understanding and agreed to proceed.    History of Present Illness: Jeanne Steele is a 24 y.o. who identifies as a female who was assigned female at birth, and is being seen today for asthma exacerbation.   Jeanne Steele has a history of asthma and is currently experiencing an exacerbation. Current symptoms include shortness of breath, wheezing, and chest tightness.  She is requesting a refill of her albuterol  inhaler at this time   Problems:  Patient Active Problem List   Diagnosis Date Noted   Annual physical exam 06/16/2021   Mild intermittent asthma 05/13/2021    Allergies: Allergies[1] Medications: Current Medications[2]  Observations/Objective: Patient is well-developed, well-nourished in no acute distress.  Resting comfortably at home.  Head is normocephalic, atraumatic.  No labored breathing. Audible wheezing Speech is clear and coherent with logical content.  Patient is alert and oriented at baseline.    Assessment and Plan: 1. Mild intermittent asthma with exacerbation - albuterol  (VENTOLIN  HFA) 108 (90 Base) MCG/ACT inhaler; Inhale 2 puffs into the lungs every 4 (four) hours as needed for wheezing or shortness of breath.  Dispense: 1 each; Refill: 0 - predniSONE  (DELTASONE ) 20 MG tablet; Take 1 tablet (20 mg total) by mouth daily with breakfast for 3 days.  Dispense: 3 tablet; Refill: 0   Follow Up Instructions: I discussed the assessment  and treatment plan with the patient. The patient was provided an opportunity to ask questions and all were answered. The patient agreed with the plan and demonstrated an understanding of the instructions.  A copy of instructions were sent to the patient via MyChart unless otherwise noted below.    The patient was advised to call back or seek  an in-person evaluation if the symptoms worsen or if the condition fails to improve as anticipated.    Haze LELON Servant, NP     [1] No Known Allergies [2]  Current Outpatient Medications:    albuterol  (VENTOLIN  HFA) 108 (90 Base) MCG/ACT inhaler, Inhale 2 puffs into the lungs every 4 (four) hours as needed for wheezing or shortness of breath., Disp: 1 each, Rfl: 0   benzonatate  (TESSALON ) 100 MG capsule, Take 1 capsule (100 mg total) by mouth 3 (three) times daily as needed for cough., Disp: 30 capsule, Rfl: 0   fluticasone -salmeterol (ADVAIR) 100-50 MCG/ACT AEPB, Inhale 1 puff into the lungs 2 (two) times daily. (Patient not taking: Reported on 12/16/2023), Disp: 60 each, Rfl: 0   JUNEL FE 1.5/30 1.5-30 MG-MCG tablet, Take 1 tablet by mouth daily. (Patient not taking: Reported on 12/16/2023), Disp: , Rfl:    levocetirizine (XYZAL ) 5 MG tablet, Take 1 tablet (5 mg total) by mouth every evening., Disp: 30 tablet, Rfl: 0   predniSONE  (DELTASONE ) 20 MG tablet, Take 1 tablet (20 mg total) by mouth daily with breakfast for 3 days., Disp: 3 tablet, Rfl: 0  "

## 2024-06-09 NOTE — Patient Instructions (Signed)
" °  Chinita Legere, thank you for joining Haze LELON Servant, NP for today's virtual visit.  While this provider is not your primary care provider (PCP), if your PCP is located in our provider database this encounter information will be shared with them immediately following your visit.   A [facility] MyChart account gives you access to today's visit and all your visits, tests, and labs performed at [facility]  click here if you don't have a [facility] MyChart account or go to [email or web address]  Consent: (Patient) Jeanne Steele provided verbal consent for this virtual visit at the beginning of the encounter.  Current Medications:  Current Outpatient Medications:    albuterol  (VENTOLIN  HFA) 108 (90 Base) MCG/ACT inhaler, Inhale 2 puffs into the lungs every 4 (four) hours as needed for wheezing or shortness of breath., Disp: 1 each, Rfl: 0   benzonatate  (TESSALON ) 100 MG capsule, Take 1 capsule (100 mg total) by mouth 3 (three) times daily as needed for cough., Disp: 30 capsule, Rfl: 0   fluticasone -salmeterol (ADVAIR) 100-50 MCG/ACT AEPB, Inhale 1 puff into the lungs 2 (two) times daily. (Patient not taking: Reported on 12/16/2023), Disp: 60 each, Rfl: 0   JUNEL FE 1.5/30 1.5-30 MG-MCG tablet, Take 1 tablet by mouth daily. (Patient not taking: Reported on 12/16/2023), Disp: , Rfl:    levocetirizine (XYZAL ) 5 MG tablet, Take 1 tablet (5 mg total) by mouth every evening., Disp: 30 tablet, Rfl: 0   predniSONE  (DELTASONE ) 20 MG tablet, Take 1 tablet (20 mg total) by mouth daily with breakfast for 3 days., Disp: 3 tablet, Rfl: 0   Medications ordered in this encounter:  Meds ordered this encounter  Medications   albuterol  (VENTOLIN  HFA) 108 (90 Base) MCG/ACT inhaler    Sig: Inhale 2 puffs into the lungs every 4 (four) hours as needed for wheezing or shortness of breath.    Dispense:  1 each    Refill:  0    Supervising Provider:   BLAISE ALEENE KIDD [[id number]]   predniSONE  (DELTASONE ) 20  MG tablet    Sig: Take 1 tablet (20 mg total) by mouth daily with breakfast for 3 days.    Dispense:  3 tablet    Refill:  0    Supervising Provider:   LAMPTEY, PHILIP O [[id number]]     *If you need refills on other medications prior to your next appointment, please contact your pharmacy*  Follow-Up: Call back or seek an in-person evaluation if the symptoms worsen or if the condition fails to improve as anticipated.  [facility] [phone]  Other Instructions   If you have been instructed to have an in-person evaluation today at a local Urgent Care facility, please use the link below. It will take you to a list of all of our available [facility], including address, phone number and hours of operation. Please do not delay care.  [facility]  If you or a family member do not have a primary care provider, use the link below to schedule a visit and establish care. When you choose a [facility] primary care physician or advanced practice provider, you gain a long-term partner in health. Find a Primary Care Provider  Learn more about [facility]'s in-office and virtual care options: [facility] - Get Care Now  "
